# Patient Record
Sex: Female | Born: 1950
Health system: Southern US, Community
[De-identification: ages and names within clinical notes are randomized; demographics above are authoritative.]

## PROBLEM LIST (undated history)

## (undated) DIAGNOSIS — M1990 Unspecified osteoarthritis, unspecified site: Secondary | ICD-10-CM

## (undated) DIAGNOSIS — I10 Essential (primary) hypertension: Secondary | ICD-10-CM

## (undated) DIAGNOSIS — T7840XA Allergy, unspecified, initial encounter: Secondary | ICD-10-CM

## (undated) DIAGNOSIS — F419 Anxiety disorder, unspecified: Secondary | ICD-10-CM

## (undated) DIAGNOSIS — Z973 Presence of spectacles and contact lenses: Secondary | ICD-10-CM

## (undated) DIAGNOSIS — R5600 Simple febrile convulsions: Secondary | ICD-10-CM

## (undated) DIAGNOSIS — E278 Other specified disorders of adrenal gland: Secondary | ICD-10-CM

## (undated) DIAGNOSIS — R011 Cardiac murmur, unspecified: Secondary | ICD-10-CM

## (undated) HISTORY — DX: Essential (primary) hypertension: I10

## (undated) HISTORY — DX: Unspecified osteoarthritis, unspecified site: M19.90

## (undated) HISTORY — DX: Cardiac murmur, unspecified: R01.1

## (undated) HISTORY — DX: Simple febrile convulsions: R56.00

## (undated) HISTORY — DX: Allergy, unspecified, initial encounter: T78.40XA

## (undated) HISTORY — DX: Presence of spectacles and contact lenses: Z97.3

## (undated) HISTORY — DX: Other specified disorders of adrenal gland: E27.8

## (undated) HISTORY — PX: BREAST ENHANCEMENT SURGERY: SHX7

## (undated) HISTORY — DX: Anxiety disorder, unspecified: F41.9

---

## 1999-01-10 ENCOUNTER — Other Ambulatory Visit: Admission: RE | Admit: 1999-01-10 | Discharge: 1999-01-10 | Payer: Self-pay | Admitting: Obstetrics and Gynecology

## 2000-03-02 ENCOUNTER — Other Ambulatory Visit: Admission: RE | Admit: 2000-03-02 | Discharge: 2000-03-02 | Payer: Self-pay | Admitting: Obstetrics and Gynecology

## 2001-06-15 ENCOUNTER — Other Ambulatory Visit: Admission: RE | Admit: 2001-06-15 | Discharge: 2001-06-15 | Payer: Self-pay | Admitting: Gynecology

## 2003-05-22 ENCOUNTER — Other Ambulatory Visit: Admission: RE | Admit: 2003-05-22 | Discharge: 2003-05-22 | Payer: Self-pay | Admitting: Gynecology

## 2004-10-11 ENCOUNTER — Other Ambulatory Visit: Admission: RE | Admit: 2004-10-11 | Discharge: 2004-10-11 | Payer: Self-pay | Admitting: Gynecology

## 2006-01-05 ENCOUNTER — Other Ambulatory Visit: Admission: RE | Admit: 2006-01-05 | Discharge: 2006-01-05 | Payer: Self-pay | Admitting: Gynecology

## 2016-08-13 DIAGNOSIS — M5412 Radiculopathy, cervical region: Secondary | ICD-10-CM | POA: Diagnosis not present

## 2016-09-02 DIAGNOSIS — H5213 Myopia, bilateral: Secondary | ICD-10-CM | POA: Diagnosis not present

## 2016-09-02 DIAGNOSIS — H52221 Regular astigmatism, right eye: Secondary | ICD-10-CM | POA: Diagnosis not present

## 2016-09-02 DIAGNOSIS — H25813 Combined forms of age-related cataract, bilateral: Secondary | ICD-10-CM | POA: Diagnosis not present

## 2016-09-04 DIAGNOSIS — G8929 Other chronic pain: Secondary | ICD-10-CM | POA: Diagnosis not present

## 2016-09-04 DIAGNOSIS — M542 Cervicalgia: Secondary | ICD-10-CM | POA: Diagnosis not present

## 2016-09-04 DIAGNOSIS — I10 Essential (primary) hypertension: Secondary | ICD-10-CM | POA: Diagnosis not present

## 2016-09-19 DIAGNOSIS — G8929 Other chronic pain: Secondary | ICD-10-CM | POA: Diagnosis not present

## 2016-09-19 DIAGNOSIS — M542 Cervicalgia: Secondary | ICD-10-CM | POA: Diagnosis not present

## 2016-09-19 DIAGNOSIS — I10 Essential (primary) hypertension: Secondary | ICD-10-CM | POA: Diagnosis not present

## 2016-10-23 DIAGNOSIS — G8929 Other chronic pain: Secondary | ICD-10-CM | POA: Diagnosis not present

## 2016-10-23 DIAGNOSIS — I10 Essential (primary) hypertension: Secondary | ICD-10-CM | POA: Diagnosis not present

## 2016-10-23 DIAGNOSIS — Z79899 Other long term (current) drug therapy: Secondary | ICD-10-CM | POA: Diagnosis not present

## 2016-10-23 DIAGNOSIS — M542 Cervicalgia: Secondary | ICD-10-CM | POA: Diagnosis not present

## 2016-12-15 DIAGNOSIS — G8929 Other chronic pain: Secondary | ICD-10-CM | POA: Diagnosis not present

## 2016-12-15 DIAGNOSIS — M542 Cervicalgia: Secondary | ICD-10-CM | POA: Diagnosis not present

## 2016-12-15 DIAGNOSIS — I10 Essential (primary) hypertension: Secondary | ICD-10-CM | POA: Diagnosis not present

## 2017-10-05 ENCOUNTER — Telehealth: Payer: Self-pay | Admitting: Internal Medicine

## 2017-10-05 NOTE — Telephone Encounter (Signed)
Copied from CRM [phone]. Topic: Quick Communication - See Telephone Encounter >> Oct 05, 2017  3:47 PM Oneal GroutSebastian, Jennifer S wrote: CRM for notification. See Telephone encounter for: 10/05/17. Patient to establish care on 10/20/17, Requesting a RX of candesartan-HCTZ 32-12.5mg  TB, able to call in before appt? Patient wanted to check and see. Please advise [facility] [phone]

## 2017-10-05 NOTE — Telephone Encounter (Signed)
This patient has never been seen here so we cant refill this medication until she is seen  Please contact last prescriber  For this patient and all new patients we can never refill medications until seen in our clinic   TMS

## 2017-10-06 NOTE — Telephone Encounter (Signed)
Patient advised of result and verbalized an understanding.  

## 2017-10-20 ENCOUNTER — Ambulatory Visit (INDEPENDENT_AMBULATORY_CARE_PROVIDER_SITE_OTHER): Payer: Medicare Other | Admitting: Internal Medicine

## 2017-10-20 VITALS — BP 144/90 | HR 59 | Temp 98.6°F | Ht 62.5 in | Wt 218.2 lb

## 2017-10-20 DIAGNOSIS — E559 Vitamin D deficiency, unspecified: Secondary | ICD-10-CM | POA: Diagnosis not present

## 2017-10-20 DIAGNOSIS — Z1329 Encounter for screening for other suspected endocrine disorder: Secondary | ICD-10-CM | POA: Diagnosis not present

## 2017-10-20 DIAGNOSIS — F419 Anxiety disorder, unspecified: Secondary | ICD-10-CM

## 2017-10-20 DIAGNOSIS — M542 Cervicalgia: Secondary | ICD-10-CM | POA: Diagnosis not present

## 2017-10-20 DIAGNOSIS — I10 Essential (primary) hypertension: Secondary | ICD-10-CM

## 2017-10-20 DIAGNOSIS — Z1389 Encounter for screening for other disorder: Secondary | ICD-10-CM

## 2017-10-20 DIAGNOSIS — R011 Cardiac murmur, unspecified: Secondary | ICD-10-CM | POA: Insufficient documentation

## 2017-10-20 DIAGNOSIS — G8929 Other chronic pain: Secondary | ICD-10-CM | POA: Insufficient documentation

## 2017-10-20 DIAGNOSIS — Z13220 Encounter for screening for lipoid disorders: Secondary | ICD-10-CM

## 2017-10-20 MED ORDER — CANDESARTAN CILEXETIL-HCTZ 32-12.5 MG PO TABS: 1.0000 | ORAL_TABLET | Freq: Every day | ORAL | 1 refills | Status: DC

## 2017-10-20 MED ORDER — FLUOXETINE HCL 10 MG PO CAPS: 10.0000 mg | ORAL_CAPSULE | Freq: Every day | ORAL | 3 refills | Status: DC

## 2017-10-20 MED ORDER — NEBIVOLOL HCL 10 MG PO TABS: 10.0000 mg | ORAL_TABLET | Freq: Every day | ORAL | 1 refills | Status: DC

## 2017-10-20 NOTE — Progress Notes (Signed)
Pre visit review using our clinic review tool, if applicable. No additional management support is needed unless otherwise documented below in the visit note. 

## 2017-10-20 NOTE — Patient Instructions (Addendum)
Try Meditation Insight Timer free app on phone  Try exercise  Try prozac 10 mg     Tdap/DTaP Vaccine (Diphtheria, Tetanus, and Pertussis): What You Need to Know 1. Why get vaccinated? Diphtheria, tetanus, and pertussis are serious diseases caused by bacteria. Diphtheria and pertussis are spread from person to person. Tetanus enters the body through cuts or wounds. DIPHTHERIA causes a thick covering in the back of the throat.  It can lead to breathing problems, paralysis, heart failure, and even death.  TETANUS (Lockjaw) causes painful tightening of the muscles, usually all over the body.  It can lead to "locking" of the jaw so the victim cannot open his mouth or swallow. Tetanus leads to death in up to 2 out of 10 cases.  PERTUSSIS (Whooping Cough) causes coughing spells so bad that it is hard for infants to eat, drink, or breathe. These spells can last for weeks.  It can lead to pneumonia, seizures (jerking and staring spells), brain damage, and death.  Diphtheria, tetanus, and pertussis vaccine (DTaP) can help prevent these diseases. Most children who are vaccinated with DTaP will be protected throughout childhood. Many more children would get these diseases if we stopped vaccinating. DTaP is a safer version of an older vaccine called DTP. DTP is no longer used in the [place]. 2. Who should get DTaP vaccine and when? Children should get 5 doses of DTaP vaccine, one dose at each of the following ages:  2 months  4 months  6 months  15-18 months  4-6 years  DTaP may be given at the same time as other vaccines. 3. Some children should not get DTaP vaccine or should wait  Children with minor illnesses, such as a cold, may be vaccinated. But children who are moderately or severely ill should usually wait until they recover before getting DTaP vaccine.  Any child who had a life-threatening allergic reaction after a dose of DTaP should not get another dose.  Any child who  suffered a brain or nervous system disease within 7 days after a dose of DTaP should not get another dose.  Talk with your doctor if your child: ? had a seizure or collapsed after a dose of DTaP, ? cried non-stop for 3 hours or more after a dose of DTaP, ? had a fever over 105F after a dose of DTaP. Ask your doctor for more information. Some of these children should not get another dose of pertussis vaccine, but may get a vaccine without pertussis, called DT. 4. Older children and adults DTaP is not licensed for adolescents, adults, or children 817 years of age and older. But older people still need protection. A vaccine called Tdap is similar to DTaP. A single dose of Tdap is recommended for people 11 through 69 years of age. Another vaccine, called Td, protects against tetanus and diphtheria, but not pertussis. It is recommended every 10 years. There are separate Vaccine Information Statements for these vaccines. 5. What are the risks from DTaP vaccine? Getting diphtheria, tetanus, or pertussis disease is much riskier than getting DTaP vaccine. However, a vaccine, like any medicine, is capable of causing serious problems, such as severe allergic reactions. The risk of DTaP vaccine causing serious harm, or death, is extremely small. Mild problems (common)  Fever (up to about 1 child in 4)  Redness or swelling where the shot was given (up to about 1 child in 4)  Soreness or tenderness where the shot was given (up to about 1  child in 4) These problems occur more often after the 4th and 5th doses of the DTaP series than after earlier doses. Sometimes the 4th or 5th dose of DTaP vaccine is followed by swelling of the entire arm or leg in which the shot was given, lasting 1-7 days (up to about 1 child in 30). Other mild problems include:  Fussiness (up to about 1 child in 3)  Tiredness or poor appetite (up to about 1 child in 10)  Vomiting (up to about 1 child in 50) These problems generally  occur 1-3 days after the shot. Moderate problems (uncommon)  Seizure (jerking or staring) (about 1 child out of 14,000)  Non-stop crying, for 3 hours or more (up to about 1 child out of 1,000)  High fever, over 105F (about 1 child out of 16,000) Severe problems (very rare)  Serious allergic reaction (less than 1 out of a million doses)  Several other severe problems have been reported after DTaP vaccine. These include: ? Long-term seizures, coma, or lowered consciousness ? Permanent brain damage. These are so rare it is hard to tell if they are caused by the vaccine. Controlling fever is especially important for children who have had seizures, for any reason. It is also important if another family member has had seizures. You can reduce fever and pain by giving your child an aspirin-free pain reliever when the shot is given, and for the next 24 hours, following the package instructions. 6. What if there is a serious reaction? What should I look for? Look for anything that concerns you, such as signs of a severe allergic reaction, very high fever, or behavior changes. Signs of a severe allergic reaction can include hives, swelling of the face and throat, difficulty breathing, a fast heartbeat, dizziness, and weakness. These would start a few minutes to a few hours after the vaccination. What should I do?  If you think it is a severe allergic reaction or other emergency that can't wait, call 9-1-1 or get the person to the nearest hospital. Otherwise, call your doctor.  Afterward, the reaction should be reported to the Vaccine Adverse Event Reporting System (VAERS). Your doctor might file this report, or you can do it yourself through the VAERS web site at www.vaers.LAgents.no, or by calling 1-614-488-9439. ? VAERS is only for reporting reactions. They do not give medical advice. 7. The National Vaccine Injury Compensation Program The [employer] Vaccine Injury Compensation Program (VICP) is a  federal program that was created to compensate people who may have been injured by certain vaccines. Persons who believe they may have been injured by a vaccine can learn about the program and about filing a claim by calling 1-854-512-4405 or visiting the VICP website at SpiritualWord.at. 8. How can I learn more?  Ask your doctor.  Call your local or state health department.  Contact the Centers for Disease Control and Prevention (CDC): ? Call 7823104983 (1-800-CDC-INFO) or ? Visit CDC's website at PicCapture.uy CDC DTaP Vaccine (Diphtheria, Tetanus, and Pertussis) VIS (07/31/05) This information is not intended to replace advice given to you by your health care provider. Make sure you discuss any questions you have with your health care provider. Document Released: 12/29/2005 Document Revised: 11/22/2015 Document Reviewed: 11/22/2015 Elsevier Interactive Patient Education  2017 [employer].

## 2017-10-21 ENCOUNTER — Encounter: Payer: Self-pay | Admitting: Internal Medicine

## 2017-10-21 NOTE — Progress Notes (Signed)
Chief Complaint  Patient presents with  . Establish Care   New patient prior PCP [facility] Dr. Sheryn Bison retired prior to that [facility] but changing due to they lost her blood   1. HTN sl elevated today at home 150s sbp on wrists on candesartan-hctz 09.3 23.5 and bystolic 10 mg qd. She reports anxiety with life and work stressors with elevates BP prev on prozac low dose and helped  2. Anxiety wants to restart prozac. She does not want addictive meds and reports benzos possibly in the past made her hyper/excited.   3. H/o cardiac murmur had echo in the past with [facility] requested records today  4. Chronic neck pain 2/2 cervical disc degeneration x 2 disc saw Dr. Jacqulyn Bath and considering PT and steroid shot c/o radiating pain down right arm.    Review of Systems  Constitutional: Negative for weight loss.  HENT: Negative for hearing loss.   Eyes: Negative for blurred vision.  Respiratory: Negative for shortness of breath.   Cardiovascular: Negative for chest pain.  Gastrointestinal: Negative for abdominal pain.  Musculoskeletal: Positive for neck pain.  Skin: Negative for rash.  Neurological: Negative for headaches.  Psychiatric/Behavioral: Negative for memory loss. The patient is nervous/anxious.    Past Medical History:  Diagnosis Date  . Allergy    mold and mildew   . Anxiety   . Arthritis    cervical disc degeneration Dr. Lanelle Bal   . Febrile seizures ([place])    childhood x 2 stopped age 55 or 67 y.o   . Heart murmur   . Hypertension    Past Surgical History:  Procedure Laterality Date  . BREAST ENHANCEMENT SURGERY     History reviewed. No pertinent family history. Social History   Socioeconomic History  . Marital status: Unknown    Spouse name: Not on file  . Number of children: Not on file  . Years of education: Not on file  . Highest education level: Not on file  Occupational History  . Not on file  Social Needs  . Financial resource strain: Not on file   . Food insecurity:    Worry: Not on file    Inability: Not on file  . Transportation needs:    Medical: Not on file    Non-medical: Not on file  Tobacco Use  . Smoking status: Never Smoker  . Smokeless tobacco: Never Used  Substance and Sexual Activity  . Alcohol use: Yes    Comment: rare   . Drug use: Not Currently  . Sexual activity: Not Currently  Lifestyle  . Physical activity:    Days per week: Not on file    Minutes per session: Not on file  . Stress: Not on file  Relationships  . Social connections:    Talks on phone: Not on file    Gets together: Not on file    Attends religious service: Not on file    Active member of club or organization: Not on file    Attends meetings of clubs or organizations: Not on file    Relationship status: Not on file  . Intimate partner violence:    Fear of current or ex partner: Not on file    Emotionally abused: Not on file    Physically abused: Not on file    Forced sexual activity: Not on file  Other Topics Concern  . Not on file  Social History Narrative  . Not on file   Current Meds  Medication  Sig  . candesartan-hydrochlorothiazide (ATACAND HCT) 32-12.5 MG tablet Take 1 tablet by mouth daily.  . nebivolol (BYSTOLIC) 10 MG tablet Take 1 tablet (10 mg total) by mouth daily.  . [DISCONTINUED] BYSTOLIC 10 MG tablet Take 10 mg by mouth daily.  . [DISCONTINUED] candesartan-hydrochlorothiazide (ATACAND HCT) 32-12.5 MG tablet Take 1 tablet by mouth daily.   No Known Allergies No results found for this or any previous visit (from the past 2160 hour(s)). Objective  Body mass index is 39.27 kg/m. Wt Readings from Last 3 Encounters:  10/20/17 218 lb 3.2 oz (99 kg)   Temp Readings from Last 3 Encounters:  10/20/17 98.6 F (37 C) (Oral)   BP Readings from Last 3 Encounters:  10/20/17 (!) 144/90   Pulse Readings from Last 3 Encounters:  10/20/17 (!) 59    Physical Exam  Constitutional: She is oriented to person, place, and  time. She appears well-developed and well-nourished. She is cooperative.  HENT:  Head: Normocephalic and atraumatic.  Mouth/Throat: Oropharynx is clear and moist and mucous membranes are normal.  Eyes: Pupils are equal, round, and reactive to light. Conjunctivae are normal.  Cardiovascular: Normal rate and regular rhythm.  Murmur heard. Pulmonary/Chest: Effort normal and breath sounds normal.  Neurological: She is alert and oriented to person, place, and time. Gait normal.  Skin: Skin is warm, dry and intact.  Psychiatric: She has a normal mood and affect. Her speech is normal and behavior is normal. Judgment and thought content normal. Cognition and memory are normal.  Nursing note and vitals reviewed.   Assessment   1. HTN  2. Anxiety  3. Cardiac murmur subtle  4. Chronic neck pain 2/2 disc degeneration x 2 cervical  5. HM Plan  1. Cont meds refilled today  Needs to sch appt fasting labs  2. Start low dose prozac  Disc meditation for anxiety  3. Get echo [facility]  4. F/u with Raliegh Ip  5.  Declines flu shot  Tdap will think about discussed today  Declines pneumonia vaccines Declines hep B/C testing may have had hep b in past  No MMR testing   sch fasting labs pt to call back to sch  Declines mammo for now will need mammo for breast augmentation  Out of age window pap Dr. Tamala Julian years ago DEXA nl 5 years ago per pt consider repeating in future pt declines today  Colonoscopy declines but agreeable cologaurd will order today  Dermatology saw 2 years ago in [place] exam summer 2018 Marica Otter  Former PCP [facility] Dr. Sheryn Bison and [facility] medical requested records Dr. Sheryn Bison   Provider: Dr. Olivia Mackie McLean-Scocuzza-Internal Medicine

## 2018-02-18 ENCOUNTER — Ambulatory Visit: Payer: Medicare Other | Admitting: Internal Medicine

## 2018-04-14 ENCOUNTER — Other Ambulatory Visit: Payer: Self-pay | Admitting: Internal Medicine

## 2018-04-14 DIAGNOSIS — I10 Essential (primary) hypertension: Secondary | ICD-10-CM

## 2018-04-14 MED ORDER — CANDESARTAN CILEXETIL-HCTZ 32-12.5 MG PO TABS: 1.0000 | ORAL_TABLET | Freq: Every day | ORAL | 1 refills | Status: DC

## 2018-05-13 ENCOUNTER — Ambulatory Visit: Payer: Medicare Other | Admitting: Internal Medicine

## 2018-07-27 ENCOUNTER — Other Ambulatory Visit: Payer: Self-pay | Admitting: Internal Medicine

## 2018-07-27 ENCOUNTER — Telehealth: Payer: Self-pay | Admitting: Internal Medicine

## 2018-07-27 DIAGNOSIS — I10 Essential (primary) hypertension: Secondary | ICD-10-CM

## 2018-07-27 MED ORDER — CANDESARTAN CILEXETIL-HCTZ 32-12.5 MG PO TABS: 1.0000 | ORAL_TABLET | Freq: Every day | ORAL | 0 refills | Status: DC

## 2018-07-27 MED ORDER — NEBIVOLOL HCL 10 MG PO TABS: 10.0000 mg | ORAL_TABLET | Freq: Every day | ORAL | 0 refills | Status: DC

## 2018-07-27 NOTE — Telephone Encounter (Signed)
Pt needs to schedule telephone, virtual visit or after 09/2018 office visit for blood pressure f/u   Thanks TMS

## 2018-07-30 ENCOUNTER — Telehealth: Payer: Self-pay | Admitting: Internal Medicine

## 2018-07-30 ENCOUNTER — Other Ambulatory Visit: Payer: Self-pay | Admitting: Internal Medicine

## 2018-07-30 DIAGNOSIS — I10 Essential (primary) hypertension: Secondary | ICD-10-CM

## 2018-07-30 MED ORDER — OLMESARTAN MEDOXOMIL-HCTZ 40-12.5 MG PO TABS: 1.0000 | ORAL_TABLET | Freq: Every day | ORAL | 0 refills | Status: DC

## 2018-07-30 MED ORDER — NEBIVOLOL HCL 10 MG PO TABS: 10.0000 mg | ORAL_TABLET | Freq: Every day | ORAL | 0 refills | Status: DC

## 2018-07-30 MED ORDER — CANDESARTAN CILEXETIL-HCTZ 32-12.5 MG PO TABS: 1.0000 | ORAL_TABLET | Freq: Every day | ORAL | 0 refills | Status: DC

## 2018-07-30 NOTE — Telephone Encounter (Signed)
Unable to leave message for patient to return call back. PEC may give and obtain information.  

## 2018-07-30 NOTE — Telephone Encounter (Signed)
Advise pt   candasaratan hctz 32-12.5 is on back order  Sent olmesartan hctz 40-12.5 instead   TMS

## 2018-07-30 NOTE — Telephone Encounter (Signed)
Medication is on back order. The candesartan is not supposed to be in stock until JUL

## 2018-07-30 NOTE — Telephone Encounter (Signed)
Call pharmacy and see what is going on with Rx Candesartan 32 mg hctz 12.5 mg daily #90 no refills ?   [phone]

## 2018-09-16 ENCOUNTER — Ambulatory Visit: Payer: Medicare Other | Admitting: Internal Medicine

## 2019-05-07 ENCOUNTER — Ambulatory Visit: Payer: Medicare Other

## 2019-05-13 DIAGNOSIS — Z23 Encounter for immunization: Secondary | ICD-10-CM | POA: Diagnosis not present

## 2019-05-25 ENCOUNTER — Encounter: Payer: Self-pay | Admitting: Internal Medicine

## 2019-05-25 ENCOUNTER — Other Ambulatory Visit: Payer: Self-pay | Admitting: Internal Medicine

## 2019-05-25 ENCOUNTER — Telehealth (INDEPENDENT_AMBULATORY_CARE_PROVIDER_SITE_OTHER): Payer: Medicare Other | Admitting: Internal Medicine

## 2019-05-25 VITALS — BP 156/73 | HR 77 | Ht 62.5 in | Wt 220.0 lb

## 2019-05-25 DIAGNOSIS — F419 Anxiety disorder, unspecified: Secondary | ICD-10-CM

## 2019-05-25 DIAGNOSIS — Z1389 Encounter for screening for other disorder: Secondary | ICD-10-CM

## 2019-05-25 DIAGNOSIS — Z1329 Encounter for screening for other suspected endocrine disorder: Secondary | ICD-10-CM

## 2019-05-25 DIAGNOSIS — E559 Vitamin D deficiency, unspecified: Secondary | ICD-10-CM | POA: Diagnosis not present

## 2019-05-25 DIAGNOSIS — I10 Essential (primary) hypertension: Secondary | ICD-10-CM | POA: Diagnosis not present

## 2019-05-25 DIAGNOSIS — R739 Hyperglycemia, unspecified: Secondary | ICD-10-CM

## 2019-05-25 MED ORDER — CANDESARTAN CILEXETIL-HCTZ 32-12.5 MG PO TABS: 1.0000 | ORAL_TABLET | Freq: Every day | ORAL | 3 refills | Status: DC

## 2019-05-25 MED ORDER — FLUOXETINE HCL 10 MG PO TABS: 5.0000 mg | ORAL_TABLET | Freq: Every day | ORAL | 3 refills | Status: DC

## 2019-05-25 MED ORDER — NEBIVOLOL HCL 10 MG PO TABS: 10.0000 mg | ORAL_TABLET | Freq: Every day | ORAL | 3 refills | Status: DC

## 2019-05-25 NOTE — Progress Notes (Signed)
Virtual Visit via Video Note  I connected with Patricia [name]  on 05/25/19 at 11:45 AM EST by a video enabled telemedicine application and verified that I am speaking with the correct person using two identifiers.  Location patient: home Location provider:work or home office Persons participating in the virtual visit: patient, provider  I discussed the limitations of evaluation and management by telemedicine and the availability of in person appointments. The patient expressed understanding and agreed to proceed.   HPI: 1. HTN needs refills of candasartan 32-hctz 24.4 mg qd and bystolic 10 mg qd BP elevated today due to being out of meds no f/u since 10/2017  when she had wt loss BP was running <120s top  She declines labs for now due to covid  2. Anxiety was improved on prozac was taking 10 mg qod b/c daily was making her not feel emotions  3. Stomach virus 2 months ago x 2-4 days after eating a sandwich she ordered out had n/v/d no fever but resolved with brat diet and peptobismol    ROS: See pertinent positives and negatives per HPI.  Past Medical History:  Diagnosis Date  . Allergy    mold and mildew   . Anxiety   . Arthritis    cervical disc degeneration Dr. Lanelle Bal   . Febrile seizures ([place])    childhood x 2 stopped age 64 or 69 y.o   . Heart murmur   . Hypertension     Past Surgical History:  Procedure Laterality Date  . BREAST ENHANCEMENT SURGERY      Family History  Problem Relation Age of Onset  . Arthritis Mother   . COPD Mother        smoker   . Hyperlipidemia Mother   . Hypertension Mother   . Cancer Father        prostate died age 17   . Early death Father   . COPD Brother        smoker     SOCIAL HX:  Divorced  2 years college  Works Biochemist, clinical  No guns, wears seat belt, safe in relationship  No kids  Has cats     Current Outpatient Medications:  .  candesartan-hydrochlorothiazide (ATACAND HCT) 32-12.5 MG tablet, Take 1  tablet by mouth daily. Take 1/2 pill x 3 days then 1 pill daily in the am, Disp: 90 tablet, Rfl: 3 .  FLUoxetine (PROZAC) 10 MG tablet, Take 0.5 tablets (5 mg total) by mouth daily., Disp: 45 tablet, Rfl: 3 .  nebivolol (BYSTOLIC) 10 MG tablet, Take 1 tablet (10 mg total) by mouth daily., Disp: 90 tablet, Rfl: 3  EXAM:  VITALS per patient if applicable:  GENERAL: alert, oriented, appears well and in no acute distress  HEENT: atraumatic, conjunttiva clear, no obvious abnormalities on inspection of external nose and ears  NECK: normal movements of the head and neck  LUNGS: on inspection no signs of respiratory distress, breathing rate appears normal, no obvious gross SOB, gasping or wheezing  CV: no obvious cyanosis  MS: moves all visible extremities without noticeable abnormality  PSYCH/NEURO: pleasant and cooperative, no obvious depression or anxiety, speech and thought processing grossly intact  ASSESSMENT AND PLAN:  Discussed the following assessment and plan: Goal BP <130/<80  Essential hypertension - Plan: nebivolol (BYSTOLIC) 10 MG tablet, candesartan-hydrochlorothiazide (ATACAND HCT) 32-12.5 MG tablet  Anxiety - Plan: FLUoxetine (PROZAC) 10 MG tablet  Take 5 mg qd   HM Declines fasting labs for  now ordered for future  Declines flu shot  Tdap will think about discussed today  1/2 covid vaccines 2nd due 05/2019  Declines pneumonia vaccines Declines hep B/C testing may have had hep b in past  No MMR testing   sch fasting labs pt to call back to sch  Declines mammo for now will need mammo for breast augmentation  Out of age window pap Dr. Tamala Julian years ago DEXA nl 5 years ago per pt consider repeating in future pt declined previously  Colonoscopy declined but agreeable cologaurd  Will order in future  Dermatology saw 2 years ago in [place] exam summer 2018 Marica Otter  Former PCP [facility] Dr. Sheryn Bison and [facility] medical requested records Dr. Sheryn Bison   -we  discussed possible serious and likely etiologies, options for evaluation and workup, limitations of telemedicine visit vs in person visit, treatment, treatment risks and precautions. Pt prefers to treat via telemedicine empirically rather then risking or undertaking an in person visit at this moment. Patient agrees to seek prompt in person care if worsening, new symptoms arise, or if is not improving with treatment.   I discussed the assessment and treatment plan with the patient. The patient was provided an opportunity to ask questions and all were answered. The patient agreed with the plan and demonstrated an understanding of the instructions.   The patient was advised to call back or seek an in-person evaluation if the symptoms worsen or if the condition fails to improve as anticipated.  Time spent 30 minutes Delorise Jackson, MD

## 2019-05-27 ENCOUNTER — Other Ambulatory Visit: Payer: Self-pay | Admitting: Internal Medicine

## 2019-05-27 DIAGNOSIS — F419 Anxiety disorder, unspecified: Secondary | ICD-10-CM

## 2019-05-27 MED ORDER — ESCITALOPRAM OXALATE 5 MG PO TABS: 5.0000 mg | ORAL_TABLET | Freq: Every day | ORAL | 3 refills | Status: DC

## 2019-06-10 DIAGNOSIS — Z23 Encounter for immunization: Secondary | ICD-10-CM | POA: Diagnosis not present

## 2019-12-12 ENCOUNTER — Telehealth: Payer: Self-pay | Admitting: Internal Medicine

## 2019-12-12 NOTE — Telephone Encounter (Signed)
Left message for patient to call back and schedule Medicare Annual Wellness Visit (AWV)   This should be a telephone visit only=30 minutes.  No hx of AWV; please schedule at anytime with Denisa O'Brien-Blaney at [place]  AWV-I PER [place] AS OF 02/15/15 

## 2020-04-24 ENCOUNTER — Telehealth: Payer: Self-pay | Admitting: Internal Medicine

## 2020-04-24 NOTE — Telephone Encounter (Signed)
Left message for patient to call back and schedule Medicare Annual Wellness Visit (AWV)   This should be a telephone visit only=30 minutes.  No hx of AWV; please schedule at anytime with Denisa O'Brien-Blaney at [facility]  AWV-I PER [place] AS OF 02/15/15

## 2020-05-15 ENCOUNTER — Other Ambulatory Visit: Payer: Self-pay | Admitting: Internal Medicine

## 2020-05-15 DIAGNOSIS — I10 Essential (primary) hypertension: Secondary | ICD-10-CM

## 2020-05-17 ENCOUNTER — Telehealth: Payer: Self-pay | Admitting: Internal Medicine

## 2020-05-17 ENCOUNTER — Other Ambulatory Visit: Payer: Self-pay | Admitting: Internal Medicine

## 2020-05-17 DIAGNOSIS — F419 Anxiety disorder, unspecified: Secondary | ICD-10-CM

## 2020-05-17 NOTE — Telephone Encounter (Signed)
Cal pt needs appt further refills of meds >1 year

## 2020-05-18 NOTE — Telephone Encounter (Signed)
Lvm for pt to return call. Appt needed for more refills

## 2020-05-30 ENCOUNTER — Telehealth: Payer: Self-pay | Admitting: Internal Medicine

## 2020-05-30 ENCOUNTER — Other Ambulatory Visit: Payer: Self-pay | Admitting: Internal Medicine

## 2020-05-30 DIAGNOSIS — I10 Essential (primary) hypertension: Secondary | ICD-10-CM

## 2020-05-30 NOTE — Telephone Encounter (Signed)
Pt needs appt further refills meds call to sch please

## 2020-05-31 NOTE — Telephone Encounter (Signed)
LVM on both numbers to schedule appt for med management-due for yearly

## 2020-06-07 DIAGNOSIS — H524 Presbyopia: Secondary | ICD-10-CM | POA: Diagnosis not present

## 2020-06-07 DIAGNOSIS — H52221 Regular astigmatism, right eye: Secondary | ICD-10-CM | POA: Diagnosis not present

## 2020-06-07 DIAGNOSIS — H2513 Age-related nuclear cataract, bilateral: Secondary | ICD-10-CM | POA: Diagnosis not present

## 2020-06-07 DIAGNOSIS — H5213 Myopia, bilateral: Secondary | ICD-10-CM | POA: Diagnosis not present

## 2020-06-25 ENCOUNTER — Other Ambulatory Visit: Payer: Self-pay | Admitting: Internal Medicine

## 2020-06-25 DIAGNOSIS — I10 Essential (primary) hypertension: Secondary | ICD-10-CM

## 2020-06-25 MED ORDER — ATENOLOL 100 MG PO TABS: 100.0000 mg | ORAL_TABLET | Freq: Every day | ORAL | 0 refills | Status: DC

## 2020-07-09 ENCOUNTER — Other Ambulatory Visit: Payer: Self-pay | Admitting: Internal Medicine

## 2020-07-09 DIAGNOSIS — F419 Anxiety disorder, unspecified: Secondary | ICD-10-CM

## 2020-12-21 ENCOUNTER — Telehealth: Payer: Self-pay | Admitting: Internal Medicine

## 2020-12-21 NOTE — Telephone Encounter (Signed)
Patient has a virtual at the end of October. She would like to do labs before appointment. No orders in Chart.

## 2020-12-21 NOTE — Telephone Encounter (Signed)
I am going to defer the ordering of these labs to Dr McLean-Scocuzza.

## 2020-12-30 ENCOUNTER — Other Ambulatory Visit: Payer: Self-pay | Admitting: Internal Medicine

## 2020-12-30 DIAGNOSIS — F419 Anxiety disorder, unspecified: Secondary | ICD-10-CM

## 2020-12-31 NOTE — Telephone Encounter (Signed)
Last OV 2021 hs appoint scheduled for 01/11/21 okay to fill?

## 2021-01-11 ENCOUNTER — Telehealth (INDEPENDENT_AMBULATORY_CARE_PROVIDER_SITE_OTHER): Payer: Medicare Other | Admitting: Internal Medicine

## 2021-01-11 ENCOUNTER — Other Ambulatory Visit: Payer: Self-pay

## 2021-01-11 ENCOUNTER — Encounter: Payer: Self-pay | Admitting: Internal Medicine

## 2021-01-11 VITALS — BP 161/93 | HR 78 | Ht 62.5 in | Wt 220.0 lb

## 2021-01-11 DIAGNOSIS — R42 Dizziness and giddiness: Secondary | ICD-10-CM

## 2021-01-11 DIAGNOSIS — I10 Essential (primary) hypertension: Secondary | ICD-10-CM | POA: Diagnosis not present

## 2021-01-11 DIAGNOSIS — Z1231 Encounter for screening mammogram for malignant neoplasm of breast: Secondary | ICD-10-CM

## 2021-01-11 DIAGNOSIS — R739 Hyperglycemia, unspecified: Secondary | ICD-10-CM

## 2021-01-11 DIAGNOSIS — R232 Flushing: Secondary | ICD-10-CM

## 2021-01-11 DIAGNOSIS — E538 Deficiency of other specified B group vitamins: Secondary | ICD-10-CM | POA: Diagnosis not present

## 2021-01-11 DIAGNOSIS — R0982 Postnasal drip: Secondary | ICD-10-CM

## 2021-01-11 DIAGNOSIS — E2839 Other primary ovarian failure: Secondary | ICD-10-CM

## 2021-01-11 DIAGNOSIS — R5383 Other fatigue: Secondary | ICD-10-CM | POA: Diagnosis not present

## 2021-01-11 DIAGNOSIS — Z13818 Encounter for screening for other digestive system disorders: Secondary | ICD-10-CM

## 2021-01-11 DIAGNOSIS — Z1211 Encounter for screening for malignant neoplasm of colon: Secondary | ICD-10-CM

## 2021-01-11 DIAGNOSIS — F458 Other somatoform disorders: Secondary | ICD-10-CM | POA: Diagnosis not present

## 2021-01-11 DIAGNOSIS — Z1212 Encounter for screening for malignant neoplasm of rectum: Secondary | ICD-10-CM

## 2021-01-11 DIAGNOSIS — E559 Vitamin D deficiency, unspecified: Secondary | ICD-10-CM | POA: Diagnosis not present

## 2021-01-11 DIAGNOSIS — R519 Headache, unspecified: Secondary | ICD-10-CM | POA: Diagnosis not present

## 2021-01-11 DIAGNOSIS — R6889 Other general symptoms and signs: Secondary | ICD-10-CM

## 2021-01-11 DIAGNOSIS — M109 Gout, unspecified: Secondary | ICD-10-CM

## 2021-01-11 MED ORDER — COLCHICINE 0.6 MG PO TABS: ORAL_TABLET | ORAL | 0 refills | Status: DC

## 2021-01-11 MED ORDER — CARVEDILOL 12.5 MG PO TABS
ORAL_TABLET | ORAL | 2 refills | Status: DC
Start: 2021-01-11 — End: 2021-03-12

## 2021-01-11 NOTE — Progress Notes (Signed)
Onset of symptoms this past summer. Patient states she is having problems with dizziness and equilibrium while standing up and moving around. Mainly would start in the mornings and then linger throughout the day. Patient was getting extremely hot and sweaty without doing much during the summer months. Not so much now with the colder weather.   No falls, injuries, or loss of consciousness. Patient checks her blood pressure at home and states this has been up and down.   Patient states that for the past 8 weeks she has had foot pain in the right foot. Suspects possible gout. States the big toe on the right foot had gotten red and swollen. No pain today.

## 2021-01-11 NOTE — Patient Instructions (Addendum)
Gout diet  Low-Purine Eating Plan A low-purine eating plan involves making food choices to limit your intake of purine. Purine is a kind of uric acid. Too much uric acid in your blood can cause certain conditions, such as gout and kidney stones. Eating a low-purine diet can help control these conditions. What are tips for following this plan? Reading food labels Avoid foods with saturated or Trans fat. Check the ingredient list of grains-based foods, such as bread and cereal, to make sure that they contain whole grains. Check the ingredient list of sauces or soups to make sure they do not contain meat or fish. When choosing soft drinks, check the ingredient list to make sure they do not contain high-fructose corn syrup. Shopping  Buy plenty of fresh fruits and vegetables. Avoid buying canned or fresh fish. Buy dairy products labeled as low-fat or nonfat. Avoid buying premade or processed foods. These foods are often high in fat, salt (sodium), and added sugar. Cooking Use olive oil instead of butter when cooking. Oils like olive oil, canola oil, and sunflower oil contain healthy fats. Meal planning Learn which foods do or do not affect you. If you find out that a food tends to cause your gout symptoms to flare up, avoid eating that food. You can enjoy foods that do not cause problems. If you have any questions about a food item, talk with your dietitian or health care provider. Limit foods high in fat, especially saturated fat. Fat makes it harder for your body to get rid of uric acid. Choose foods that are lower in fat and are lean sources of protein. General guidelines Limit alcohol intake to no more than 1 drink a day for nonpregnant women and 2 drinks a day for men. One drink equals 12 oz of beer, 5 oz of wine, or 1 oz of hard liquor. Alcohol can affect the way your body gets rid of uric acid. Drink plenty of water to keep your urine clear or pale yellow. Fluids can help remove uric acid  from your body. If directed by your health care provider, take a vitamin C supplement. Work with your health care provider and dietitian to develop a plan to achieve or maintain a healthy weight. Losing weight can help reduce uric acid in your blood. What foods are recommended? The items listed may not be a complete list. Talk with your dietitian about what dietary choices are best for you. Foods low in purines Foods low in purines do not need to be limited. These include: All fruits. All low-purine vegetables, pickles, and olives. Breads, pasta, rice, cornbread, and popcorn. Cake and other baked goods. All dairy foods. Eggs, nuts, and nut butters. Spices and condiments, such as salt, herbs, and vinegar. Plant oils, butter, and margarine. Water, sugar-free soft drinks, tea, coffee, and cocoa. Vegetable-based soups, broths, sauces, and gravies. Foods moderate in purines Foods moderate in purines should be limited to the amounts listed.  cup of asparagus, cauliflower, spinach, mushrooms, or green peas, each day. 2/3 cup uncooked oatmeal, each day.  cup dry wheat bran or wheat germ, each day. 2-3 ounces of meat or poultry, each day. 4-6 ounces of shellfish, such as crab, lobster, oysters, or shrimp, each day. 1 cup cooked beans, peas, or lentils, each day. Soup, broths, or bouillon made from meat or fish. Limit these foods as much as possible. What foods are not recommended? The items listed may not be a complete list. Talk with your dietitian about what dietary  choices are best for you. Limit your intake of foods high in purines, including: Beer and other alcohol. Meat-based gravy or sauce. Canned or fresh fish, such as: Anchovies, sardines, herring, and tuna. Mussels and scallops. Codfish, trout, and haddock. Berniece Salines. Organ meats, such as: Liver or kidney. Tripe. Sweetbreads (thymus gland or pancreas). Wild Clinical biochemist. Yeast or yeast extract supplements. Drinks sweetened  with high-fructose corn syrup. Summary Eating a low-purine diet can help control conditions caused by too much uric acid in the body, such as gout or kidney stones. Choose low-purine foods, limit alcohol, and limit foods high in fat. You will learn over time which foods do or do not affect you. If you find out that a food tends to cause your gout symptoms to flare up, avoid eating that food. This information is not intended to replace advice given to you by your health care provider. Make sure you discuss any questions you have with your health care provider. Document Revised: 06/16/2019 Document Reviewed: 06/16/2019 Elsevier Patient Education  2022 [place].  Gout Gout is a condition that causes painful swelling of the joints. Gout is a type of inflammation of the joints (arthritis). This condition is caused by having too much uric acid in the body. Uric acid is a chemical that forms when the body breaks down substances called purines. Purines are important for building body proteins. When the body has too much uric acid, sharp crystals can form and build up inside the joints. This causes pain and swelling. Gout attacks can happen quickly and may be very painful (acute gout). Over time, the attacks can affect more joints and become more frequent (chronic gout). Gout can also cause uric acid to build up under the skin and inside the kidneys. What are the causes? This condition is caused by too much uric acid in your blood. This can happen because: Your kidneys do not remove enough uric acid from your blood. This is the most common cause. Your body makes too much uric acid. This can happen with some cancers and cancer treatments. It can also occur if your body is breaking down too many red blood cells (hemolytic anemia). You eat too many foods that are high in purines. These foods include organ meats and some seafood. Alcohol, especially beer, is also high in purines. A gout attack may be  triggered by trauma or stress. What increases the risk? You are more likely to develop this condition if you: Have a family history of gout. Are female and middle-aged. Are female and have gone through menopause. Are obese. Frequently drink alcohol, especially beer. Are dehydrated. Lose weight too quickly. Have an organ transplant. Have lead poisoning. Take certain medicines, including aspirin, cyclosporine, diuretics, levodopa, and niacin. Have kidney disease. Have a skin condition called psoriasis. What are the signs or symptoms? An attack of acute gout happens quickly. It usually occurs in just one joint. The most common place is the big toe. Attacks often start at night. Other joints that may be affected include joints of the feet, ankle, knee, fingers, wrist, or elbow. Symptoms of this condition may include: Severe pain. Warmth. Swelling. Stiffness. Tenderness. The affected joint may be very painful to touch. Shiny, red, or purple skin. Chills and fever. Chronic gout may cause symptoms more frequently. More joints may be involved. You may also have white or yellow lumps (tophi) on your hands or feet or in other areas near your joints. How is this diagnosed? This condition is diagnosed  based on your symptoms, medical history, and physical exam. You may have tests, such as: Blood tests to measure uric acid levels. Removal of joint fluid with a thin needle (aspiration) to look for uric acid crystals. X-rays to look for joint damage. How is this treated? Treatment for this condition has two phases: treating an acute attack and preventing future attacks. Acute gout treatment may include medicines to reduce pain and swelling, including: NSAIDs. Steroids. These are strong anti-inflammatory medicines that can be taken by mouth (orally) or injected into a joint. Colchicine. This medicine relieves pain and swelling when it is taken soon after an attack. It can be given by mouth or through  an IV. Preventive treatment may include: Daily use of smaller doses of NSAIDs or colchicine. Use of a medicine that reduces uric acid levels in your blood. Changes to your diet. You may need to see a dietitian about what to eat and drink to prevent gout. Follow these instructions at home: During a gout attack  If directed, put ice on the affected area: Put ice in a plastic bag. Place a towel between your skin and the bag. Leave the ice on for 20 minutes, 2-3 times a day. Raise (elevate) the affected joint above the level of your heart as often as possible. Rest the joint as much as possible. If the affected joint is in your leg, you may be given crutches to use. Follow instructions from your health care provider about eating or drinking restrictions. Avoiding future gout attacks Follow a low-purine diet as told by your dietitian or health care provider. Avoid foods and drinks that are high in purines, including liver, kidney, anchovies, asparagus, herring, mushrooms, mussels, and beer. Maintain a healthy weight or lose weight if you are overweight. If you want to lose weight, talk with your health care provider. It is important that you do not lose weight too quickly. Start or maintain an exercise program as told by your health care provider. Eating and drinking Drink enough fluids to keep your urine pale yellow. If you drink alcohol: Limit how much you use to: 0-1 drink a day for women. 0-2 drinks a day for men. Be aware of how much alcohol is in your drink. In the [place] one drink equals one 12 oz bottle of beer (355 mL) one 5 oz glass of wine (148 mL), or one 1 oz glass of hard liquor (44 mL). General instructions Take over-the-counter and prescription medicines only as told by your health care provider. Do not drive or use heavy machinery while taking prescription pain medicine. Return to your normal activities as told by your health care provider. Ask your health care provider what  activities are safe for you. Keep all follow-up visits as told by your health care provider. This is important. Contact a health care provider if you have: Another gout attack. Continuing symptoms of a gout attack after 10 days of treatment. Side effects from your medicines. Chills or a fever. Burning pain when you urinate. Pain in your lower back or belly. Get help right away if you: Have severe or uncontrolled pain. Cannot urinate. Summary Gout is painful swelling of the joints caused by inflammation. The most common site of pain is the big toe, but it can affect other joints in the body. Medicines and dietary changes can help to prevent and treat gout attacks. This information is not intended to replace advice given to you by your health care provider. Make sure you discuss any  questions you have with your health care provider. Document Revised: 09/23/2017 Document Reviewed: 09/23/2017 Elsevier Patient Education  [place].  Fatigue If you have fatigue, you feel tired all the time and have a lack of energy or a lack of motivation. Fatigue may make it difficult to start or complete tasks because of exhaustion. In general, occasional or mild fatigue is often a normal response to activity or life. However, long-lasting (chronic) or extreme fatigue may be a symptom of a medical condition. Follow these instructions at home: General instructions Watch your fatigue for any changes. Go to bed and get up at the same time every day. Avoid fatigue by pacing yourself during the day and getting enough sleep at night. Maintain a healthy weight. Medicines Take over-the-counter and prescription medicines only as told by your health care provider. Take a multivitamin, if told by your health care provider.  Do not use herbal or dietary supplements unless they are approved by your health care provider. Activity  Exercise regularly, as told by your health care provider. Use or practice  techniques to help you relax, such as yoga, tai chi, meditation, or massage therapy. Eating and drinking  Avoid heavy meals in the evening. Eat a well-balanced diet, which includes lean proteins, whole grains, plenty of fruits and vegetables, and low-fat dairy products. Avoid consuming too much caffeine. Avoid the use of alcohol. Drink enough fluid to keep your urine pale yellow. Lifestyle Change situations that cause you stress. Try to keep your work and personal schedule in balance. Do not use any products that contain nicotine or tobacco, such as cigarettes and e-cigarettes. If you need help quitting, ask your health care provider. Do not use drugs. Contact a health care provider if: Your fatigue does not get better. You have a fever. You suddenly lose or gain weight. You have headaches. You have trouble falling asleep or sleeping through the night. You feel angry, guilty, anxious, or sad. You are unable to have a bowel movement (constipation). Your skin is dry. You have swelling in your legs or another part of your body. Get help right away if: You feel confused. Your vision is blurry. You feel faint or you pass out. You have a severe headache. You have severe pain in your abdomen, your back, or the area between your waist and hips (pelvis). You have chest pain, shortness of breath, or an irregular or fast heartbeat. You are unable to urinate, or you urinate less than normal. You have abnormal bleeding, such as bleeding from the rectum, vagina, nose, lungs, or nipples. You vomit blood. You have thoughts about hurting yourself or others. If you ever feel like you may hurt yourself or others, or have thoughts about taking your own life, get help right away. You can go to your nearest emergency department or call: Your local emergency services (911 in the [place] A suicide crisis helpline, such as the [place] at (605) 498-5821. This is open 24 hours a  day. Summary If you have fatigue, you feel tired all the time and have a lack of energy or a lack of motivation. Fatigue may make it difficult to start or complete tasks because of exhaustion. Long-lasting (chronic) or extreme fatigue may be a symptom of a medical condition. Exercise regularly, as told by your health care provider. Change situations that cause you stress. Try to keep your work and personal schedule in balance. This information is not intended to replace advice given to you by your  health care provider. Make sure you discuss any questions you have with your health care provider. Document Revised: 01/12/2020 Document Reviewed: 01/12/2020 Elsevier Patient Education  2022 [employer].

## 2021-01-11 NOTE — Progress Notes (Signed)
Telephone Note  I connected with Patricia [name]  on 01/11/21 at  3:50 PM EDT by telephone and verified that I am speaking with the correct person using two identifiers.  Location patient: home, [place] Location provider:work or home office Persons participating in the virtual visit: patient, provider  I discussed the limitations of evaluation and management by telemedicine and the availability of in person appointments. The patient expressed understanding and agreed to proceed.   HPI:  Acute telemedicine visit for : Right foot gout thinks her brother had gout red, swollen 2-3 months ago bunion on both feet was pain below big toe lasted x 2 days came back and lasted 8 weeks takes prn advil dual strength. Aleve does not help now sore but if steps certain way kind of hurts,no injury  Dizziness x 2 weeks and dull h/a nasal congestion and forehead/eyes and ears tried claritin otc and helped worse in the am and at times all day long. She feels like has energy again and doing more, denies nasal congestion  Fatigue noted worse recently but had x 2 years  C/o overheating in the summers and sweating esp during summers  5.   Prev lesion right side of head above temple hurt at times years ago very painful prior PCP D.r Abigail Miyamoto noted it with pt she reports she does grind her teeth on the right side a lot note sure if related  6. Htn on candasartan hctz and atenolol 100 mg qd out of bb x 2-3 months and BP elevated   -COVID-19 vaccine status: 1/1  ROS: See pertinent positives and negatives per HPI.  Past Medical History:  Diagnosis Date   Allergy    mold and mildew    Anxiety    Arthritis    cervical disc degeneration Dr. Pablo Ledger    Febrile seizures [facility])    childhood x 2 stopped age 58 or 70 y.o    Heart murmur    Hypertension     Past Surgical History:  Procedure Laterality Date   BREAST ENHANCEMENT SURGERY       Current Outpatient Medications:    candesartan-hydrochlorothiazide  (ATACAND HCT) 32-12.5 MG tablet, TAKE 1/2 PILL X 3 DAYS THEN 1 PILL DAILY IN THE MOERNING, Disp: 90 tablet, Rfl: 3   carvedilol (COREG) 12.5 MG tablet, 1/2 pill bid x 3 days with food then increase to 1 pill bid day 4 and beyond stop atenolol, Disp: 60 tablet, Rfl: 2   colchicine 0.6 MG tablet, 2 pills x 1 then 0.6 mg 1 hour later max dose 1.8/24 hours do not repeat x 3 days, Disp: 30 tablet, Rfl: 0   escitalopram (LEXAPRO) 5 MG tablet, TAKE 1 TABLET BY MOUTH EVERY DAY, Disp: 90 tablet, Rfl: 1   Loratadine (CLARITIN PO), Take by mouth., Disp: , Rfl:    Omeprazole Magnesium (PRILOSEC PO), Take by mouth., Disp: , Rfl:   EXAM:  VITALS per patient if applicable:  GENERAL: alert, oriented, appears well and in no acute distress  PSYCH/NEURO: pleasant and cooperative, no obvious depression or anxiety, speech and thought processing grossly intact  ASSESSMENT AND PLAN:  Discussed the following assessment and plan:  Hypertension, unspecified type - Plan: Comprehensive metabolic panel, Lipid panel, CBC with Differential/Platelet, carvedilol (COREG) 12.5 MG tablet bid 1/2 bid x 3 days and day 4 take 1 pill bid stop atenolol 100 mg qd  Cont candasartan hctz 32-12.5 mg qd BP check 01/24/21  Monitor BP at home   Fatigue  Plan: Comprehensive metabolic panel, CBC with Differential/Platelet, TSH, Urinalysis, Routine w reflex microscopic, Cortisol-am, blood,b12, vitamin D  Gout, unspecified cause, unspecified chronicity, unspecified site - Plan: Uric acid, colchicine 0.6 MG tablet (1.2 repeat 0.6 in 1 hour) right great toe  Gout diet  Hyperglycemia - Plan: Hemoglobin A1c  Dizziness with uncontrolled htn and h/o h/a sinus pressure r/o sinusitis sxs of htn vs r/o stroke- Plan: CT HEAD WO CONTRAST ( )  Hot flashes/heat intolerance  Teeth grinding f/u dental  PND (post-nasal drip) claritin 10 mg prn otc is helping  HM Flu due Tdap vaccine due Pfizer x 1  Consider prevar due Consider pna 23  due Consider shingrix due  Mammogram ordered GIB Cologuard sent order  Out of age window for pap  Dexa consider  Rec healthy diet and exercise   -we discussed possible serious and likely etiologies, options for evaluation and workup, limitations of telemedicine visit vs in person visit, treatment, treatment risks and precautions. Pt is agreeable to treatment via telemedicine at this moment.    I discussed the assessment and treatment plan with the patient. The patient was provided an opportunity to ask questions and all were answered. The patient agreed with the plan and demonstrated an understanding of the instructions.    Time spent 30 minutes Bevelyn Buckles, MD

## 2021-01-14 ENCOUNTER — Other Ambulatory Visit: Payer: Self-pay | Admitting: Internal Medicine

## 2021-01-14 DIAGNOSIS — R519 Headache, unspecified: Secondary | ICD-10-CM

## 2021-01-14 DIAGNOSIS — R42 Dizziness and giddiness: Secondary | ICD-10-CM

## 2021-01-15 NOTE — Addendum Note (Signed)
Addended by: Tilford Pillar on: 01/15/2021 09:25 AM   Modules accepted: Orders

## 2021-01-24 ENCOUNTER — Ambulatory Visit: Payer: Medicare Other

## 2021-01-24 ENCOUNTER — Other Ambulatory Visit: Payer: Medicare Other

## 2021-01-31 ENCOUNTER — Other Ambulatory Visit: Payer: Medicare Other

## 2021-01-31 ENCOUNTER — Ambulatory Visit: Payer: Medicare Other

## 2021-02-06 ENCOUNTER — Ambulatory Visit: Payer: Medicare Other

## 2021-02-14 ENCOUNTER — Ambulatory Visit: Payer: Medicare Other

## 2021-02-22 ENCOUNTER — Other Ambulatory Visit: Payer: Self-pay

## 2021-02-22 ENCOUNTER — Ambulatory Visit: Payer: Medicare Other

## 2021-02-22 ENCOUNTER — Ambulatory Visit
Admission: RE | Admit: 2021-02-22 | Discharge: 2021-02-22 | Disposition: A | Discharge status: Home / Self Care | Payer: Medicare Other | Source: Ambulatory Visit | Attending: Internal Medicine | Admitting: Internal Medicine

## 2021-02-22 DIAGNOSIS — R519 Headache, unspecified: Secondary | ICD-10-CM | POA: Diagnosis not present

## 2021-02-22 DIAGNOSIS — R42 Dizziness and giddiness: Secondary | ICD-10-CM | POA: Insufficient documentation

## 2021-02-26 ENCOUNTER — Telehealth: Payer: Self-pay

## 2021-02-26 NOTE — Telephone Encounter (Signed)
-----   Message from Bevelyn Buckles, MD sent at 02/25/2021  9:18 AM EST ----- CT head normal  No cause for dizziness Monitor BP goal <130/<80  Also if has h/o vertigo or the room spinning could be vertigo consider ENT referral let me know?

## 2021-03-08 ENCOUNTER — Other Ambulatory Visit: Payer: Self-pay | Admitting: Internal Medicine

## 2021-03-08 DIAGNOSIS — I10 Essential (primary) hypertension: Secondary | ICD-10-CM

## 2021-03-14 ENCOUNTER — Ambulatory Visit: Payer: Medicare Other

## 2021-03-22 ENCOUNTER — Ambulatory Visit: Payer: Medicare Other

## 2021-04-11 ENCOUNTER — Ambulatory Visit: Payer: Medicare Other

## 2021-04-17 ENCOUNTER — Ambulatory Visit: Payer: Medicare Other

## 2021-05-05 ENCOUNTER — Other Ambulatory Visit: Payer: Self-pay | Admitting: Internal Medicine

## 2021-05-05 DIAGNOSIS — I10 Essential (primary) hypertension: Secondary | ICD-10-CM

## 2021-05-09 ENCOUNTER — Ambulatory Visit: Payer: Medicare Other

## 2021-05-10 ENCOUNTER — Ambulatory Visit: Payer: Medicare Other

## 2021-05-23 ENCOUNTER — Ambulatory Visit: Payer: Medicare Other

## 2021-06-06 ENCOUNTER — Other Ambulatory Visit: Payer: Self-pay | Admitting: Internal Medicine

## 2021-06-06 DIAGNOSIS — I10 Essential (primary) hypertension: Secondary | ICD-10-CM

## 2021-06-07 ENCOUNTER — Ambulatory Visit: Payer: Medicare Other | Admitting: Internal Medicine

## 2021-06-07 ENCOUNTER — Ambulatory Visit: Payer: Medicare Other

## 2021-06-14 ENCOUNTER — Ambulatory Visit: Payer: Medicare Other | Admitting: Internal Medicine

## 2021-06-27 ENCOUNTER — Other Ambulatory Visit: Payer: Self-pay | Admitting: Family

## 2021-06-27 DIAGNOSIS — F419 Anxiety disorder, unspecified: Secondary | ICD-10-CM

## 2021-07-01 ENCOUNTER — Other Ambulatory Visit: Payer: Self-pay | Admitting: Internal Medicine

## 2021-07-01 ENCOUNTER — Telehealth: Payer: Self-pay | Admitting: Internal Medicine

## 2021-07-01 ENCOUNTER — Telehealth: Payer: Self-pay

## 2021-07-01 DIAGNOSIS — F419 Anxiety disorder, unspecified: Secondary | ICD-10-CM

## 2021-07-01 MED ORDER — ESCITALOPRAM OXALATE 5 MG PO TABS: 5.0000 mg | ORAL_TABLET | Freq: Every day | ORAL | 0 refills | Status: DC

## 2021-07-01 NOTE — Telephone Encounter (Signed)
Pt overdue for labs ordered 12/2020  ?Sch lab visit and then f/u in person after labs  ?Advise no further refills on meds until both items complete please  ? ?

## 2021-07-01 NOTE — Telephone Encounter (Signed)
Lvm for pt to return call to inform her that per [id number]: ? ?Pt overdue for labs ordered 12/2020  ?Sch lab visit and then f/u in person after labs  ?Advise no further refills on meds until both items complete please  ?

## 2021-07-12 ENCOUNTER — Ambulatory Visit: Payer: Medicare Other | Admitting: Internal Medicine

## 2021-07-12 ENCOUNTER — Ambulatory Visit: Payer: Medicare Other

## 2021-07-26 ENCOUNTER — Ambulatory Visit: Payer: Medicare Other | Admitting: Internal Medicine

## 2021-07-26 ENCOUNTER — Ambulatory Visit: Payer: Medicare Other

## 2021-08-09 ENCOUNTER — Ambulatory Visit (INDEPENDENT_AMBULATORY_CARE_PROVIDER_SITE_OTHER): Payer: Medicare Other | Admitting: *Deleted

## 2021-08-09 ENCOUNTER — Ambulatory Visit: Payer: Medicare Other | Admitting: Internal Medicine

## 2021-08-09 ENCOUNTER — Telehealth: Payer: Self-pay | Admitting: Internal Medicine

## 2021-08-09 DIAGNOSIS — M109 Gout, unspecified: Secondary | ICD-10-CM | POA: Diagnosis not present

## 2021-08-09 DIAGNOSIS — R6889 Other general symptoms and signs: Secondary | ICD-10-CM | POA: Diagnosis not present

## 2021-08-09 DIAGNOSIS — E559 Vitamin D deficiency, unspecified: Secondary | ICD-10-CM

## 2021-08-09 DIAGNOSIS — I10 Essential (primary) hypertension: Secondary | ICD-10-CM

## 2021-08-09 DIAGNOSIS — R5383 Other fatigue: Secondary | ICD-10-CM

## 2021-08-09 DIAGNOSIS — Z13818 Encounter for screening for other digestive system disorders: Secondary | ICD-10-CM | POA: Diagnosis not present

## 2021-08-09 DIAGNOSIS — E538 Deficiency of other specified B group vitamins: Secondary | ICD-10-CM | POA: Diagnosis not present

## 2021-08-09 DIAGNOSIS — R739 Hyperglycemia, unspecified: Secondary | ICD-10-CM

## 2021-08-09 LAB — CBC WITH DIFFERENTIAL/PLATELET
Basophils Absolute: 0.1 10*3/uL (ref 0.0–0.1)
Basophils Relative: 1.4 % (ref 0.0–3.0)
Eosinophils Absolute: 0.2 10*3/uL (ref 0.0–0.7)
Eosinophils Relative: 3.4 % (ref 0.0–5.0)
HCT: 39.3 % (ref 36.0–46.0)
Hemoglobin: 12.9 g/dL (ref 12.0–15.0)
Lymphocytes Relative: 22.2 % (ref 12.0–46.0)
Lymphs Abs: 1.5 10*3/uL (ref 0.7–4.0)
MCHC: 32.9 g/dL (ref 30.0–36.0)
MCV: 88.6 fl (ref 78.0–100.0)
Monocytes Absolute: 0.6 10*3/uL (ref 0.1–1.0)
Monocytes Relative: 8.1 % (ref 3.0–12.0)
Neutro Abs: 4.5 10*3/uL (ref 1.4–7.7)
Neutrophils Relative %: 64.9 % (ref 43.0–77.0)
Platelets: 223 10*3/uL (ref 150.0–400.0)
RBC: 4.43 Mil/uL (ref 3.87–5.11)
RDW: 14.6 % (ref 11.5–15.5)
WBC: 6.9 10*3/uL (ref 4.0–10.5)

## 2021-08-09 LAB — COMPREHENSIVE METABOLIC PANEL
ALT: 10 U/L (ref 0–35)
AST: 14 U/L (ref 0–37)
Albumin: 4.4 g/dL (ref 3.5–5.2)
Alkaline Phosphatase: 52 U/L (ref 39–117)
BUN: 26 mg/dL — ABNORMAL HIGH (ref 6–23)
CO2: 24 mEq/L (ref 19–32)
Calcium: 9.9 mg/dL (ref 8.4–10.5)
Chloride: 102 mEq/L (ref 96–112)
Creatinine, Ser: 1.41 mg/dL — ABNORMAL HIGH (ref 0.40–1.20)
GFR: 37.29 mL/min — ABNORMAL LOW (ref >60.00)
Glucose, Bld: 89 mg/dL (ref 70–99)
Potassium: 4.3 mEq/L (ref 3.5–5.1)
Sodium: 140 mEq/L (ref 135–145)
Total Bilirubin: 0.7 mg/dL (ref 0.2–1.2)
Total Protein: 6.8 g/dL (ref 6.0–8.3)

## 2021-08-09 LAB — HEMOGLOBIN A1C: Hgb A1c MFr Bld: 5.2 % (ref 4.6–6.5)

## 2021-08-09 LAB — VITAMIN B12: Vitamin B-12: 245 pg/mL (ref 211–911)

## 2021-08-09 LAB — LIPID PANEL
Cholesterol: 215 mg/dL — ABNORMAL HIGH (ref 0–200)
HDL: 61.1 mg/dL (ref >39.00)
LDL Cholesterol: 136 mg/dL — ABNORMAL HIGH (ref 0–99)
NonHDL: 153.58
Total CHOL/HDL Ratio: 4
Triglycerides: 88 mg/dL (ref 0.0–149.0)
VLDL: 17.6 mg/dL (ref 0.0–40.0)

## 2021-08-09 LAB — TSH: TSH: 4.38 u[IU]/mL (ref 0.35–5.50)

## 2021-08-09 LAB — URIC ACID: Uric Acid, Serum: 9.2 mg/dL — ABNORMAL HIGH (ref 2.4–7.0)

## 2021-08-09 LAB — VITAMIN D 25 HYDROXY (VIT D DEFICIENCY, FRACTURES): VITD: 43.84 ng/mL (ref 30.00–100.00)

## 2021-08-09 NOTE — Progress Notes (Signed)
Patient here for nurse visit BP check per order from 07/01/2021.   Patient reports compliance with prescribed BP medications: yes  Patient taking :  ATACAND HCT 32-12.5 MG tablet and carvedilol (COREG) 12.5 MG tablet  BP Readings from Last 3 Encounters:  08/09/21 134/82  01/11/21 (!) 161/93  05/25/19 (!) 156/73   Pulse Readings from Last 3 Encounters:  08/09/21 74  01/11/21 78  05/25/19 77   Laying down for 5 minutes : B/P 138/84                                               HR : 85 Standing for 1 minute : BP 120/76                                      HR : 64 Standing for 3 minutes : BP 122/78                                        HR : 74  Patient advised to keep follow up appointments. Patient verbalized understanding of instructions.   Zacarias Pontes, CMA

## 2021-08-09 NOTE — Telephone Encounter (Signed)
See note

## 2021-08-10 LAB — URINALYSIS, ROUTINE W REFLEX MICROSCOPIC
Bilirubin Urine: NEGATIVE
Glucose, UA: NEGATIVE
Hgb urine dipstick: NEGATIVE
Leukocytes,Ua: NEGATIVE
Nitrite: NEGATIVE
Protein, ur: NEGATIVE
Specific Gravity, Urine: 1.022 (ref 1.001–1.035)
pH: 5.5 (ref 5.0–8.0)

## 2021-08-13 LAB — HEPATITIS C ANTIBODY
Hepatitis C Ab: NONREACTIVE
SIGNAL TO CUT-OFF: 0.07 (ref <1.00)

## 2021-08-13 LAB — CORTISOL-AM, BLOOD: Cortisol - AM: 23.4 ug/dL — ABNORMAL HIGH

## 2021-08-23 ENCOUNTER — Ambulatory Visit (INDEPENDENT_AMBULATORY_CARE_PROVIDER_SITE_OTHER): Payer: Medicare Other | Admitting: Internal Medicine

## 2021-08-23 ENCOUNTER — Ambulatory Visit (INDEPENDENT_AMBULATORY_CARE_PROVIDER_SITE_OTHER): Payer: Medicare Other

## 2021-08-23 ENCOUNTER — Ambulatory Visit: Payer: Medicare Other

## 2021-08-23 ENCOUNTER — Encounter: Payer: Self-pay | Admitting: Internal Medicine

## 2021-08-23 VITALS — BP 110/80 | HR 77 | Temp 98.6°F | Resp 14 | Ht 62.5 in | Wt 209.0 lb

## 2021-08-23 DIAGNOSIS — M47812 Spondylosis without myelopathy or radiculopathy, cervical region: Secondary | ICD-10-CM | POA: Diagnosis not present

## 2021-08-23 DIAGNOSIS — M545 Low back pain, unspecified: Secondary | ICD-10-CM

## 2021-08-23 DIAGNOSIS — N3000 Acute cystitis without hematuria: Secondary | ICD-10-CM

## 2021-08-23 DIAGNOSIS — M542 Cervicalgia: Secondary | ICD-10-CM

## 2021-08-23 DIAGNOSIS — R809 Proteinuria, unspecified: Secondary | ICD-10-CM

## 2021-08-23 DIAGNOSIS — I10 Essential (primary) hypertension: Secondary | ICD-10-CM | POA: Diagnosis not present

## 2021-08-23 DIAGNOSIS — F419 Anxiety disorder, unspecified: Secondary | ICD-10-CM

## 2021-08-23 DIAGNOSIS — R1084 Generalized abdominal pain: Secondary | ICD-10-CM

## 2021-08-23 DIAGNOSIS — N1832 Chronic kidney disease, stage 3b: Secondary | ICD-10-CM | POA: Diagnosis not present

## 2021-08-23 DIAGNOSIS — Z1231 Encounter for screening mammogram for malignant neoplasm of breast: Secondary | ICD-10-CM

## 2021-08-23 DIAGNOSIS — R0600 Dyspnea, unspecified: Secondary | ICD-10-CM | POA: Diagnosis not present

## 2021-08-23 DIAGNOSIS — M109 Gout, unspecified: Secondary | ICD-10-CM | POA: Diagnosis not present

## 2021-08-23 DIAGNOSIS — M47816 Spondylosis without myelopathy or radiculopathy, lumbar region: Secondary | ICD-10-CM

## 2021-08-23 DIAGNOSIS — R7989 Other specified abnormal findings of blood chemistry: Secondary | ICD-10-CM

## 2021-08-23 DIAGNOSIS — R011 Cardiac murmur, unspecified: Secondary | ICD-10-CM

## 2021-08-23 DIAGNOSIS — E2839 Other primary ovarian failure: Secondary | ICD-10-CM | POA: Diagnosis not present

## 2021-08-23 MED ORDER — ALLOPURINOL 100 MG PO TABS: 50.0000 mg | ORAL_TABLET | Freq: Every day | ORAL | 3 refills | Status: DC

## 2021-08-23 MED ORDER — ESCITALOPRAM OXALATE 10 MG PO TABS: 10.0000 mg | ORAL_TABLET | Freq: Every day | ORAL | 3 refills | Status: DC

## 2021-08-23 MED ORDER — COLCHICINE 0.6 MG PO TABS: ORAL_TABLET | ORAL | 5 refills | Status: DC

## 2021-08-23 MED ORDER — CARVEDILOL 12.5 MG PO TABS: 12.5000 mg | ORAL_TABLET | Freq: Two times a day (BID) | ORAL | 3 refills | Status: DC

## 2021-08-23 NOTE — Patient Instructions (Addendum)
Dr. Pearson Grippe  [facility] [place],  [place]  [id number] Get Driving Directions Main: [phone]  Allopurinol Tablets What is this medication? ALLOPURINOL (al oh PURE i nole) treats gout or kidney stones. It may also be used to prevent high uric acid levels after chemotherapy. It works by decreasing uric acid levels in your body. This medicine may be used for other purposes; ask your health care provider or pharmacist if you have questions. COMMON BRAND NAME(S): Zyloprim What should I tell my care team before I take this medication? They need to know if you have any of these conditions: Kidney disease Liver disease An unusual or allergic reaction to allopurinol, other medications, foods, dyes, or preservatives Pregnant or trying to get pregnant Breast-feeding How should I use this medication? Take this medication by mouth with a glass of water. Follow the directions on the prescription label. If this medication upsets your stomach, take it with food or milk. Take your doses at regular intervals. Do not take your medication more often than directed. Talk to your care team regarding the use of this medication in children. Special care may be needed. While this medication may be prescribed for children as young as 6 years for selected conditions, precautions do apply. Overdosage: If you think you have taken too much of this medicine contact a poison control center or emergency room at once. NOTE: This medicine is only for you. Do not share this medicine with others. What if I miss a dose? If you miss a dose, take it as soon as you can. If it is almost time for your next dose, take only that dose. Do not take double or extra doses. What may interact with this medication? Do not take this medication with the following: Didanosine, ddI This medication may also interact with the following: Certain antibiotics like amoxicillin, ampicillin Certain medications for  cancer Certain medications for immunosuppression like azathioprine, cyclosporine, mercaptopurine Chlorpropamide Probenecid Sulfinpyrazone Thiazide diuretics, like hydrochlorothiazide Warfarin This list may not describe all possible interactions. Give your health care provider a list of all the medicines, herbs, non-prescription drugs, or dietary supplements you use. Also tell them if you smoke, drink alcohol, or use illegal drugs. Some items may interact with your medicine. What should I watch for while using this medication? Visit your care team for regular checks on your progress. If you are taking this medication to treat gout, you may not have less frequent attacks at first. Keep taking your medication regularly and the attacks should get better within 2 to 6 weeks. Drink plenty of water (10 to 12 full glasses a day) while you are taking this medication. This will help to reduce stomach upset and reduce the risk of getting gout or kidney stones. Call your care team at once if you get a skin rash together with chills, fever, sore throat, or nausea and vomiting, if you have blood in your urine, or difficulty passing urine. This medication may cause serious skin reactions. They can happen weeks to months after starting the medication. Contact your care team right away if you notice fevers or flu-like symptoms with a rash. The rash may be red or purple and then turn into blisters or peeling of the skin. Or, you might notice a red rash with swelling of the face, lips or lymph nodes in your neck or under your arms. Do not take vitamin C without asking your care team. Too much vitamin C can increase the chance of  getting kidney stones. You may get drowsy or dizzy. Do not drive, use machinery, or do anything that needs mental alertness until you know how this medication affects you. Do not stand or sit up quickly, especially if you are an older patient. This reduces the risk of dizzy or fainting spells.  Alcohol can make you more drowsy and dizzy. Alcohol can also increase the chance of stomach problems and increase the amount of uric acid in your blood. Avoid alcoholic drinks. What side effects may I notice from receiving this medication? Side effects that you should report to your care team as soon as possible: Allergic reactions--skin rash, itching, hives, swelling of the face, lips, tongue, or throat Kidney injury--decrease in the amount of urine, swelling of the ankles, hands, or feet Liver injury--right upper belly pain, loss of appetite, nausea, light-colored stool, dark yellow or brown urine, yellowing skin or eyes, unusual weakness or fatigue Rash, fever, and swollen lymph nodes Redness, blistering, peeling, or loosening of the skin, including inside the mouth Side effects that usually do not require medical attention (report to your care team if they continue or are bothersome): Diarrhea Drowsiness Nausea Vomiting This list may not describe all possible side effects. Call your doctor for medical advice about side effects. You may report side effects to FDA at 1-800-FDA-1088. Where should I keep my medication? Keep out of the reach of children and pets. Store at room temperature between 15 and 25 degrees C (59 and 77 degrees F). Protect from light and moisture. Throw away any unused medication after the expiration date. NOTE: This sheet is a summary. It may not cover all possible information. If you have questions about this medicine, talk to your doctor, pharmacist, or health care provider.  2023 Elsevier/Gold Standard (2020-04-11 00:00:00)  Preventing Chronic Kidney Disease Chronic kidney disease (CKD) occurs when the kidneys are slowly and permanently damaged over a long period of time. The kidneys are two organs that do many important jobs in the body, including: Removing waste and extra fluid from the blood to make urine. Making hormones that maintain the amount of fluid in  tissues and blood vessels. Maintaining the right amount of fluids and electrolytes in the body. A small amount of kidney damage may not cause problems, but a large amount of damage may make it hard or impossible for the kidneys to work the way they should. CKD gets worse over time. You can take steps to prevent CKD or to keep it from getting worse. The best way to prevent kidney damage is to know your risk factors and make changes before you develop symptoms of CKD. How can this condition affect me? At first, you may not notice any signs or symptoms of CKD. Symptoms develop slowly and may not be obvious until the kidney damage becomes severe. If steps are not taken to prevent or slow down the disease, CKD can lead to: A low red blood cell count (anemia). Heart disease. Weak bones. Nerve damage. Stroke. Kidney failure and dialysis. Changes in urination, such as less urine, more urine, or blood in the urine. What can increase my risk? You are more likely to develop CKD if you: Are 71 years of age or older. Are obese. Have taken certain medicines for a long time. Use tobacco or have used it in the past. Have any of the following conditions: Diabetes. High blood pressure. Heart disease. Multiple myeloma. An autoimmune disease. Frequent urinary tract infections. Polycystic kidney disease. High cholesterol. Have a  family history of kidney disease, heart disease, diabetes, or high blood pressure. Have problems with urine flow that may be caused by: Cancer. Having kidney stones more than once. An enlarged prostate in males. What actions can I take to prevent CKD? Managing conditions that put you at risk Talk to your health care provider about your kidney health and your risk factors for CKD. Work with your health care provider to manage conditions such as high blood pressure, diabetes, or high cholesterol. This may involve taking medicines, eating healthy, or making lifestyle changes to  help get the following measures down to the target that your health care provider recommends: Blood pressure. Blood sugar (glucose) levels. Cholesterol. Eating and drinking  Follow instructions from your health care provider about diet. This may include: Limiting salt (sodium) intake. You should have less than 1 tsp (2,300 mg) of sodium a day. If you have heart disease or high blood pressure, you should have less than  tsp (1,725 mg) of sodium a day. Limiting protein intake as told by your health care provider. Avoid high-protein foods. Eating a balanced, heart-healthy diet. Avoiding foods that are high in potassium and phosphorous. Limit alcohol. If you drink alcohol: Limit how much you use to: 0-1 drink a day for women who are not pregnant. 0-2 drinks a day for men. Know how much alcohol is in your drink. In the [place] one drink equals one 12 oz bottle of beer (355 mL), one 5 oz glass of wine (148 mL), or one 1 oz glass of hard liquor (44 mL). If you have diabetes, work with a Financial planner (Firefighter) or a certified diabetes educator to develop a healthy eating plan. Talk with your health care provider about how much fluid you should drink each day. Lifestyle  Exercise for at least 30 minutes on 5 or more days of the week, or as much as told by your health care provider. Keep your weight at a healthy level. If you are overweight or obese, lose weight as told by your health care provider. Do not use any products that contain nicotine or tobacco, such as cigarettes, e-cigarettes, and chewing tobacco. If you need help quitting, ask your health care provider. General instructions Take over-the-counter and prescription medicines only as told by your health care provider. Do not take any new medicines unless approved by your health care provider. Use NSAIDs, such as ibuprofen, for pain only when necessary. Ask your health care provider about other pain medicines that do not  increase your risk of developing CKD. Have a yearly physical exam. Learn about your family's medical history. Talk to your relatives and siblings about diabetes, heart disease, and high blood pressure. Where to find more information Learn more about CKD and how to prevent CKD from: [place]: www.kidney.org American Association of Kidney Patients: BombTimer.gl American Diabetes Association: www.diabetes.org Summary Symptoms of CKD develop slowly and may not be obvious until the kidney damage becomes severe. The best way to prevent kidney damage is to know your risk factors and make nutrition and lifestyle changes before you develop symptoms of CKD. Follow instructions from your health care provider about diet, which may include limiting how much salt, protein, and alcohol you consume. Work with your health care provider to keep your blood pressure, cholesterol, and blood sugar levels within the recommended range. This information is not intended to replace advice given to you by your health care provider. Make sure you discuss any questions you have with your  health care provider. Document Revised: 06/30/2019 Document Reviewed: 06/03/2019 Elsevier Patient Education  [place] for Chronic Kidney Disease Chronic kidney disease (CKD) occurs when the kidneys are permanently damaged over a long period of time. When your kidneys are not working well, they cannot remove waste, fluids, and other substances from your blood as well as they did before. The substances can build up, which can worsen kidney damage and affect how your body functions. Certain foods lead to a buildup of these substances. By changing your diet, you can help prevent more kidney damage and delay or prevent the need for dialysis. What are tips for following this plan? Reading food labels Check the amount of salt (sodium) in foods. Choose foods that have less than 300 milligrams (mg) per  serving. Check the ingredient list for phosphorus or potassium-based additives or preservatives. Check the amount of saturated fat and trans fat. Limit or avoid these fats as told by your dietitian. Shopping Avoid buying foods that are: Processed or prepackaged. Calcium-enriched or that have calcium added to them (are fortified). Do not buy foods that have salt or sodium listed among the first five ingredients. Buy canned vegetables and beans that say "no salt added" or "low sodium" and rinse them before eating. Cooking Soak vegetables, such as potatoes, before cooking to reduce potassium. To do this: Peel and cut the vegetables into small pieces. Soak the vegetables in warm water for at least 2 hours. For every 1 cup of vegetables, use 10 cups of water. Drain and rinse the vegetables with warm water. Boil the vegetables for at least 5 minutes. Meal planning Limit the amount of protein you eat from plant and animal sources each day. Do not add salt to food when cooking or before eating. Eat meals and snacks at around the same time each day. General information Talk with your health care provider about whether you should take a vitamin and mineral supplement. Use standard measuring cups and spoons to measure servings of foods. Use a kitchen scale to measure portions of protein foods. If told by your health care provider, avoid drinking too much fluid. Measure and count all liquids, including water, ice, soups, flavored gelatin, and frozen desserts such as ice pops or ice cream. If you have diabetes: If you have diabetes (diabetes mellitus) and CKD, it is important to keep your blood sugar (glucose) in the target range recommended by your health care provider. Follow your diabetes management plan. This may include: Checking your blood glucose regularly. Taking medicines by mouth, taking insulin, or taking both. Exercising for at least 30 minutes on 5 or more days each week, or as told by  your health care provider. Tracking how many servings of carbohydrates you eat at each meal. You may be given specific guidelines on how much of certain foods and nutrients you may eat, depending on your stage of kidney disease and whether you have high blood pressure (hypertension). Follow your meal plan as told by your dietitian. What nutrients should I limit? Work with your health care provider and dietitian to develop a meal plan that is right for you. Foods you can eat and foods you should limit or avoid will depend on the stage of your kidney disease and any other health conditions you have. The items listed below are not a complete list. Talk with your dietitian about what dietary choices are best for you. Potassium Potassium affects how steadily your heart beats. If too much potassium  builds up in your blood, the potassium can cause an irregular heartbeat or even a heart attack. You may need to limit or avoid foods that are high in potassium, such as: Milk and soy milk. Fruits, such as bananas, apricots, nectarines, melon, prunes, raisins, kiwi, and oranges. Vegetables, such as potatoes, sweet potatoes, yams, tomatoes, leafy greens, beets, avocado, pumpkin, and winter squash. White and lima beans. Whole-wheat breads and pastas. Beans and nuts. Phosphorus Phosphorus is a mineral found in your bones. A balance between calcium and phosphorus is needed to build and maintain healthy bones. Too much phosphorus pulls calcium from your bones. This can make your bones weak and more likely to break. Too much phosphorus can also make your skin itch. You may need to limit or avoid foods that are high in phosphorus, such as: Milk and dairy products. Dried beans and peas. Tofu, soy milk, and other soy-based meat replacements. Dark-colored sodas. Nuts and peanut butter. Meat, poultry, and fish. Bran cereals and oatmeal. Protein  Protein helps you make and keep muscle. It also helps to repair your  body's cells and tissues. One of the natural breakdown products of protein is a waste product called urea. When your kidneys are not working properly, they cannot remove wastes, such as urea. Reducing how much protein you eat can help prevent a buildup of urea in your blood. Depending on your stage of kidney disease, you may need to limit foods that are high in protein. Sources of animal protein include: Meat (all types). Fish and seafood. Poultry. Eggs. Dairy. Other protein foods include: Beans and legumes. Nuts and nut butter. Soy and tofu.  Sodium Sodium helps to maintain a healthy balance of fluids in your body. Too much sodium can increase your blood pressure and have a negative effect on your heart and lungs. Too much sodium can also cause your body to retain too much fluid, making your kidneys work harder. Most people should have less than 2,300 mg of sodium each day. If you have hypertension, you may need to limit your sodium to 1,500 mg each day. You may need to limit or avoid foods that are high in sodium, such as: Salt seasonings. Soy sauce. Cured and processed meats. Salted crackers and snack foods. Fast food. Canned soups and most canned foods. Pickled foods. Vegetable juice. Boxed mixes or ready-to-eat boxed meals and side dishes. Bottled dressings, sauces, and marinades. Talk with your dietitian about how much potassium, phosphorus, protein, and sodium you may have each day. Summary Chronic kidney disease (CKD) can lead to a buildup of waste and extra substances in the body. Certain foods lead to a buildup of these substances. By changing your diet as told, you can help prevent more kidney damage and delay or prevent the need for dialysis. Food intake changes are different for each person with CKD. Work with a dietitian to set up nutrient goals and a meal plan that is right for you. If you have diabetes and CKD, it is important to keep your blood sugar in the target range  recommended by your health care provider. This information is not intended to replace advice given to you by your health care provider. Make sure you discuss any questions you have with your health care provider. Document Revised: 06/27/2019 Document Reviewed: 06/27/2019 Elsevier Patient Education  [place].  Chronic Kidney Disease, Adult Chronic kidney disease (CKD) occurs when the kidneys are slowly and permanently damaged over a long period of time. The kidneys are  a pair of organs that do many important jobs in the body, including: Removing waste and extra fluid from the blood to make urine. Making hormones that maintain the amount of fluid in tissues and blood vessels. Maintaining the right amount of fluids and chemicals in the body. A small amount of kidney damage may not cause problems, but a large amount of damage may make it hard or impossible for the kidneys to work right. Steps must be taken to slow kidney damage or to stop it from getting worse. If steps are not taken, the kidneys may stop working permanently (end-stage renal disease, or ESRD). Most of the time, CKD does not go away, but it can often be controlled. People who have CKD are usually able to live full lives. What are the causes? The most common causes of this condition are diabetes and high blood pressure (hypertension). Other causes include: Cardiovascular diseases. These affect the heart and blood vessels. Kidney diseases. These include: Glomerulonephritis, or inflammation of the tiny filters in the kidneys. Interstitial nephritis. This is swelling of the small tubes of the kidneys and of the surrounding structures. Polycystic kidney disease, in which clusters of fluid-filled sacs form within the kidneys. Renal vascular disease. This includes disorders that affect the arteries and veins of the kidneys. Diseases that affect the body's defense system (immune system). A problem with urine flow. This may be  caused by: Kidney stones. Cancer. An enlarged prostate, in males. A kidney infection or urinary tract infection (UTI) that keeps coming back. Vasculitis. This is swelling or inflammation of the blood vessels. What increases the risk? Your chances of having kidney disease increase with age. The following factors may make you more likely to develop this condition: A family history of kidney disease or kidney failure. Kidney failure means the kidneys can no longer work right. Certain genetic diseases. Taking medicines often that are damaging to the kidneys. Being around or being in contact with toxic substances. Obesity. A history of tobacco use. What are the signs or symptoms? Symptoms of this condition include: Feeling very tired (lethargic) and having less energy. Swelling, or edema, of the face, legs, ankles, or feet. Nausea or vomiting, or loss of appetite. Confusion or trouble concentrating. Muscle twitches and cramps, especially in the legs. Dry, itchy skin. A metallic taste in the mouth. Producing less urine, or producing more urine (especially at night). Shortness of breath. Trouble sleeping. CKD may also result in not having enough red blood cells or hemoglobin in the blood (anemia) or having weak bones (bone disease). Symptoms develop slowly and may not be obvious until the kidney damage becomes severe. It is possible to have kidney disease for years without having symptoms. How is this diagnosed? This condition may be diagnosed based on: Blood tests. Urine tests. Imaging tests, such as an ultrasound or a CT scan. A kidney biopsy. This involves removing a sample of kidney tissue to be looked at under a microscope. Results from these tests will help to determine how serious the CKD is. How is this treated? There is no cure for most cases of this condition, but treatment usually relieves symptoms and prevents or slows the worsening of the disease. Treatment may  include: Diet changes, which may require you to avoid alcohol and foods that are high in salt, potassium, phosphorous, and protein. Medicines. These may: Lower blood pressure. Control blood sugar (glucose). Relieve anemia. Relieve swelling. Protect your bones. Improve the balance of salts and minerals in your blood (electrolytes).  Dialysis, which is a type of treatment that removes toxic waste from the body. It may be needed if you have kidney failure. Managing any other conditions that are causing your CKD or making it worse. Follow these instructions at home: Medicines Take over-the-counter and prescription medicines only as told by your health care provider. The amount of some medicines that you take may need to be changed. Do not take any new medicines unless approved by your health care provider. Many medicines can make kidney damage worse. Do not take any vitamin and mineral supplements unless approved by your health care provider. Many nutritional supplements can make kidney damage worse. Lifestyle  Do not use any products that contain nicotine or tobacco, such as cigarettes, e-cigarettes, and chewing tobacco. If you need help quitting, ask your health care provider. If you drink alcohol: Limit how much you use to: 0-1 drink a day for women who are not pregnant. 0-2 drinks a day for men. Know how much alcohol is in your drink. In the [place] one drink equals one 12 oz bottle of beer (355 mL), one 5 oz glass of wine (148 mL), or one 1 oz glass of hard liquor (44 mL). Maintain a healthy weight. If you need help, ask your health care provider. General instructions  Follow instructions from your health care provider about eating or drinking restrictions, including any prescribed diet. Track your blood pressure at home. Report changes in your blood pressure as told. If you are being treated for diabetes, track your blood glucose levels as told. Start or continue an exercise plan.  Exercise at least 30 minutes a day, 5 days a week. Keep your immunizations up to date as told. Keep all follow-up visits. This is important. Where to find more information American Association of Kidney Patients: BombTimer.gl National Kidney Foundation: www.kidney.[place]: https://mathis.com/ Life Options: www.lifeoptions.org Kidney School: www.kidneyschool.org Contact a health care provider if: Your symptoms get worse. You develop new symptoms. Get help right away if: You develop symptoms of ESRD. These include: Headaches. Numbness in your hands or feet. Easy bruising. Frequent hiccups. Chest pain. Shortness of breath. Lack of menstrual periods, in women. You have a fever. You are producing less urine than usual. You have pain or bleeding when you urinate or when you have a bowel movement. These symptoms may represent a serious problem that is an emergency. Do not wait to see if the symptoms will go away. Get medical help right away. Call your local emergency services (911 in the [place] Do not drive yourself to the hospital. Summary Chronic kidney disease (CKD) occurs when the kidneys become damaged slowly over a long period of time. The most common causes of this condition are diabetes and high blood pressure (hypertension). There is no cure for most cases of CKD, but treatment usually relieves symptoms and prevents or slows the worsening of the disease. Treatment may include a combination of lifestyle changes, medicines, and dialysis. This information is not intended to replace advice given to you by your health care provider. Make sure you discuss any questions you have with your health care provider. Document Revised: 06/08/2019 Document Reviewed: 06/08/2019 Elsevier Patient Education  [place].  Pneumococcal Conjugate Vaccine (Prevnar 20) Suspension for Injection What is this medication? PNEUMOCOCCAL VACCINE (NEU mo KOK al vak SEEN) is a vaccine. It  prevents pneumococcus bacterial infections. These bacteria can cause serious infections like pneumonia, meningitis, and blood infections. This vaccine will not treat an infection and will  not cause infection. This vaccine is recommended for adults 18 years and older. This medicine may be used for other purposes; ask your health care provider or pharmacist if you have questions. COMMON BRAND NAME(S): Prevnar 20 What should I tell my care team before I take this medication? They need to know if you have any of these conditions: bleeding disorder fever immune system problems an unusual or allergic reaction to pneumococcal vaccine, diphtheria toxoid, other vaccines, other medicines, foods, dyes, or preservatives pregnant or trying to get pregnant breast-feeding How should I use this medication? This vaccine is injected into a muscle. It is given by a health care provider. A copy of Vaccine Information Statements will be given before each vaccination. Be sure to read this information carefully each time. This sheet may change often. Talk to your health care provider about the use of this medicine in children. Special care may be needed. Overdosage: If you think you have taken too much of this medicine contact a poison control center or emergency room at once. NOTE: This medicine is only for you. Do not share this medicine with others. What if I miss a dose? This does not apply. This medicine is not for regular use. What may interact with this medication? medicines for cancer chemotherapy medicines that suppress your immune function steroid medicines like prednisone or cortisone This list may not describe all possible interactions. Give your health care provider a list of all the medicines, herbs, non-prescription drugs, or dietary supplements you use. Also tell them if you smoke, drink alcohol, or use illegal drugs. Some items may interact with your medicine. What should I watch for while using  this medication? Mild fever and pain should go away in 3 days or less. Report any unusual symptoms to your health care provider. What side effects may I notice from receiving this medication? Side effects that you should report to your doctor or health care professional as soon as possible: allergic reactions (skin rash, itching or hives; swelling of the face, lips, or tongue) confusion fast, irregular heartbeat fever over 102 degrees F muscle weakness seizures trouble breathing unusual bruising or bleeding Side effects that usually do not require medical attention (report to your doctor or health care professional if they continue or are bothersome): fever of 102 degrees F or less headache joint pain muscle cramps, pain pain, tender at site where injected This list may not describe all possible side effects. Call your doctor for medical advice about side effects. You may report side effects to FDA at 1-800-FDA-1088. Where should I keep my medication? This vaccine is only given by a health care provider. It will not be stored at home. NOTE: This sheet is a summary. It may not cover all possible information. If you have questions about this medicine, talk to your doctor, pharmacist, or health care provider.  2023 Elsevier/Gold Standard (2019-11-17 00:00:00)

## 2021-08-24 LAB — MICROALBUMIN / CREATININE URINE RATIO
Creatinine, Urine: 202 mg/dL (ref 20–275)
Microalb Creat Ratio: 31 mcg/mg creat — ABNORMAL HIGH (ref <30)
Microalb, Ur: 6.2 mg/dL

## 2021-08-24 LAB — URINE CULTURE
MICRO NUMBER:: 13506505
Result:: NO GROWTH
SPECIMEN QUALITY:: ADEQUATE

## 2021-08-24 LAB — SODIUM, URINE, RANDOM: Sodium, Ur: 23 mmol/L — ABNORMAL LOW (ref 28–272)

## 2021-08-25 ENCOUNTER — Encounter: Payer: Self-pay | Admitting: Internal Medicine

## 2021-08-25 ENCOUNTER — Other Ambulatory Visit: Payer: Self-pay | Admitting: Internal Medicine

## 2021-08-25 DIAGNOSIS — N1832 Chronic kidney disease, stage 3b: Secondary | ICD-10-CM

## 2021-08-25 DIAGNOSIS — I10 Essential (primary) hypertension: Secondary | ICD-10-CM

## 2021-08-25 DIAGNOSIS — I700 Atherosclerosis of aorta: Secondary | ICD-10-CM | POA: Insufficient documentation

## 2021-08-25 MED ORDER — AMLODIPINE BESYLATE 5 MG PO TABS: 5.0000 mg | ORAL_TABLET | Freq: Every day | ORAL | 3 refills | Status: DC

## 2021-08-26 ENCOUNTER — Telehealth: Payer: Self-pay

## 2021-08-26 NOTE — Telephone Encounter (Signed)
Lvm for pt to return call in regards to x-ray & lab results.

## 2021-08-27 ENCOUNTER — Encounter: Payer: Self-pay | Admitting: Internal Medicine

## 2021-08-27 DIAGNOSIS — M47816 Spondylosis without myelopathy or radiculopathy, lumbar region: Secondary | ICD-10-CM | POA: Insufficient documentation

## 2021-08-27 DIAGNOSIS — R809 Proteinuria, unspecified: Secondary | ICD-10-CM | POA: Insufficient documentation

## 2021-08-27 DIAGNOSIS — R7989 Other specified abnormal findings of blood chemistry: Secondary | ICD-10-CM | POA: Insufficient documentation

## 2021-08-27 DIAGNOSIS — M47812 Spondylosis without myelopathy or radiculopathy, cervical region: Secondary | ICD-10-CM | POA: Insufficient documentation

## 2021-08-27 LAB — BASIC METABOLIC PANEL
BUN/Creatinine Ratio: 19 (calc) (ref 6–22)
BUN: 34 mg/dL — ABNORMAL HIGH (ref 7–25)
CO2: 22 mmol/L (ref 20–32)
Calcium: 10.4 mg/dL (ref 8.6–10.4)
Chloride: 98 mmol/L (ref 98–110)
Creat: 1.79 mg/dL — ABNORMAL HIGH (ref 0.60–1.00)
Glucose, Bld: 89 mg/dL (ref 65–99)
Potassium: 4.3 mmol/L (ref 3.5–5.3)
Sodium: 135 mmol/L (ref 135–146)

## 2021-08-27 LAB — CREATININE WITH EST GFR: Creat: 1.79 mg/dL — ABNORMAL HIGH (ref 0.60–1.00)

## 2021-08-27 LAB — TEST AUTHORIZATION

## 2021-08-27 NOTE — Progress Notes (Addendum)
Chief Complaint  Patient presents with   Follow-up    Annual f/u, disc labs from 2 wks ago.   F/u  1. Labs indicate CKD 3B and prerenal on coreg 12.5 mg bid and candesartan-hctz 32-12.5 mg qd  2. Mood d/o refill of lexapro wants to increase to 10 mg had sister and brother pass recently  3. C/o mild neck and low back pain disc avoid nsaids  3. Labs elevated cortisol will do further w/u     Review of Systems  Constitutional:  Negative for weight loss.  HENT:  Negative for hearing loss.   Eyes:  Negative for blurred vision.  Respiratory:  Negative for shortness of breath.   Cardiovascular:  Negative for chest pain.  Gastrointestinal:  Negative for abdominal pain and blood in stool.  Genitourinary:  Negative for dysuria.  Musculoskeletal:  Negative for falls and joint pain.  Skin:  Negative for rash.  Neurological:  Negative for headaches.  Psychiatric/Behavioral:  Negative for depression.    Past Medical History:  Diagnosis Date   Allergy    mold and mildew    Anxiety    Arthritis    cervical disc degeneration Dr. Lanelle Bal    Febrile seizures [facility])    childhood x 2 stopped age 18 or 71 y.o    Heart murmur    Hypertension    Past Surgical History:  Procedure Laterality Date   BREAST ENHANCEMENT SURGERY     Family History  Problem Relation Age of Onset   Arthritis Mother    COPD Mother        smoker    Hyperlipidemia Mother    Hypertension Mother    Cancer Father        prostate died age 23    Early death Father    Kidney disease Sister        [place]   Stroke Sister    COPD Brother        smoker    Social History   Socioeconomic History   Marital status: Unknown    Spouse name: Not on file   Number of children: Not on file   Years of education: Not on file   Highest education level: Not on file  Occupational History   Not on file  Tobacco Use   Smoking status: Never   Smokeless tobacco: Never  Substance and Sexual Activity   Alcohol use:  Yes    Comment: rare    Drug use: Not Currently   Sexual activity: Not Currently  Other Topics Concern   Not on file  Social History Narrative   Divorced    2 years college    Works Biochemist, clinical    No guns, wears seat belt, safe in relationship    No kids    Has cats    Social Determinants of Radio broadcast assistant Strain: Not on file  Food Insecurity: Not on file  Transportation Needs: Not on file  Physical Activity: Not on file  Stress: Not on file  Social Connections: Not on file  Intimate Partner Violence: Not on file   Current Meds  Medication Sig   allopurinol (ZYLOPRIM) 100 MG tablet Take 0.5 tablets (50 mg total) by mouth daily.   candesartan-hydrochlorothiazide (ATACAND HCT) 32-12.5 MG tablet TAKE 1/2 TABLET BY MOUTH DAILY FOR 3 DAYS, THEN INCREASE TO 1 TABLET DAILY IN THE MORNING   Loratadine (CLARITIN PO) Take by mouth.   Omeprazole Magnesium (PRILOSEC PO) Take  by mouth.   [DISCONTINUED] carvedilol (COREG) 12.5 MG tablet TAKE 1/2 TABLET BY MOUTH TWICE A DAY FOR 3 DAYS WITH FOOD THEN TO 1 TAB TWICE A DAY STOP ATENOLOL   [DISCONTINUED] colchicine 0.6 MG tablet 2 pills x 1 then 0.6 mg 1 hour later max dose 1.8/24 hours do not repeat x 3 days   [DISCONTINUED] escitalopram (LEXAPRO) 5 MG tablet Take 1 tablet (5 mg total) by mouth daily. Overdue for labs call the office to schedule asap   Allergies  Allergen Reactions   Molds & Smuts Swelling   Recent Results (from the past 2160 hour(s))  Cortisol-am, blood     Status: Abnormal   Collection Time: 08/09/21 10:25 AM  Result Value Ref Range   Cortisol - AM 23.4 (H) mcg/dL    Comment: Reference Range 8 a.m. (7-9 a.m.) Specimen: 4.0-22.0 .   Hepatitis C antibody     Status: None   Collection Time: 08/09/21 10:25 AM  Result Value Ref Range   Hepatitis C Ab NON-REACTIVE NON-REACTIVE   SIGNAL TO CUT-OFF 0.07 <1.00    Comment: . HCV antibody was non-reactive. There is no laboratory  evidence of HCV  infection. . In most cases, no further action is required. However, if recent HCV exposure is suspected, a test for HCV RNA (test code 44628) is suggested. . For additional information please refer to http://education.questdiagnostics.com/faq/FAQ22v1 (This link is being provided for informational/ educational purposes only.) .   Uric acid     Status: Abnormal   Collection Time: 08/09/21 10:25 AM  Result Value Ref Range   Uric Acid, Serum 9.2 (H) 2.4 - 7.0 mg/dL  Vitamin M38     Status: None   Collection Time: 08/09/21 10:25 AM  Result Value Ref Range   Vitamin B-12 245 211 - 911 pg/mL  Vitamin D (25 hydroxy)     Status: None   Collection Time: 08/09/21 10:25 AM  Result Value Ref Range   VITD 43.84 30.00 - 100.00 ng/mL  Hemoglobin A1c     Status: None   Collection Time: 08/09/21 10:25 AM  Result Value Ref Range   Hgb A1c MFr Bld 5.2 4.6 - 6.5 %    Comment: Glycemic Control Guidelines for People with Diabetes:Non Diabetic:  <6%Goal of Therapy: <7%Additional Action Suggested:  >8%   Urinalysis, Routine w reflex microscopic     Status: Abnormal   Collection Time: 08/09/21 10:25 AM  Result Value Ref Range   Color, Urine YELLOW YELLOW   APPearance CLOUDY (A) CLEAR   Specific Gravity, Urine 1.022 1.001 - 1.035   pH 5.5 5.0 - 8.0   Glucose, UA NEGATIVE NEGATIVE   Bilirubin Urine NEGATIVE NEGATIVE   Ketones, ur TRACE (A) NEGATIVE   Hgb urine dipstick NEGATIVE NEGATIVE   Protein, ur NEGATIVE NEGATIVE   Nitrite NEGATIVE NEGATIVE   Leukocytes,Ua NEGATIVE NEGATIVE  TSH     Status: None   Collection Time: 08/09/21 10:25 AM  Result Value Ref Range   TSH 4.38 0.35 - 5.50 uIU/mL  CBC with Differential/Platelet     Status: None   Collection Time: 08/09/21 10:25 AM  Result Value Ref Range   WBC 6.9 4.0 - 10.5 K/uL   RBC 4.43 3.87 - 5.11 Mil/uL   Hemoglobin 12.9 12.0 - 15.0 g/dL   HCT 17.7 11.6 - 57.9 %   MCV 88.6 78.0 - 100.0 fl   MCHC 32.9 30.0 - 36.0 g/dL   RDW 03.8 33.3 - 83.2  %  Platelets 223.0 150.0 - 400.0 K/uL   Neutrophils Relative % 64.9 43.0 - 77.0 %   Lymphocytes Relative 22.2 12.0 - 46.0 %   Monocytes Relative 8.1 3.0 - 12.0 %   Eosinophils Relative 3.4 0.0 - 5.0 %   Basophils Relative 1.4 0.0 - 3.0 %   Neutro Abs 4.5 1.4 - 7.7 K/uL   Lymphs Abs 1.5 0.7 - 4.0 K/uL   Monocytes Absolute 0.6 0.1 - 1.0 K/uL   Eosinophils Absolute 0.2 0.0 - 0.7 K/uL   Basophils Absolute 0.1 0.0 - 0.1 K/uL  Lipid panel     Status: Abnormal   Collection Time: 08/09/21 10:25 AM  Result Value Ref Range   Cholesterol 215 (H) 0 - 200 mg/dL    Comment: ATP III Classification       Desirable:  < 200 mg/dL               Borderline High:  200 - 239 mg/dL          High:  > = 240 mg/dL   Triglycerides 88.0 0.0 - 149.0 mg/dL    Comment: Normal:  <150 mg/dLBorderline High:  150 - 199 mg/dL   HDL 61.10 >39.00 mg/dL   VLDL 17.6 0.0 - 40.0 mg/dL   LDL Cholesterol 136 (H) 0 - 99 mg/dL   Total CHOL/HDL Ratio 4     Comment:                Men          Women1/2 Average Risk     3.4          3.3Average Risk          5.0          4.42X Average Risk          9.6          7.13X Average Risk          15.0          11.0                       NonHDL 153.58     Comment: NOTE:  Non-HDL goal should be 30 mg/dL higher than patient's LDL goal (i.e. LDL goal of < 70 mg/dL, would have non-HDL goal of < 100 mg/dL)  Comprehensive metabolic panel     Status: Abnormal   Collection Time: 08/09/21 10:25 AM  Result Value Ref Range   Sodium 140 135 - 145 mEq/L   Potassium 4.3 3.5 - 5.1 mEq/L   Chloride 102 96 - 112 mEq/L   CO2 24 19 - 32 mEq/L   Glucose, Bld 89 70 - 99 mg/dL   BUN 26 (H) 6 - 23 mg/dL   Creatinine, Ser 1.41 (H) 0.40 - 1.20 mg/dL   Total Bilirubin 0.7 0.2 - 1.2 mg/dL   Alkaline Phosphatase 52 39 - 117 U/L   AST 14 0 - 37 U/L   ALT 10 0 - 35 U/L   Total Protein 6.8 6.0 - 8.3 g/dL   Albumin 4.4 3.5 - 5.2 g/dL   GFR 37.29 (L) >60.00 mL/min    Comment: Calculated using the CKD-EPI  Creatinine Equation (2021)   Calcium 9.9 8.4 - 10.5 mg/dL  Sodium, urine, random     Status: Abnormal   Collection Time: 08/23/21  4:28 PM  Result Value Ref Range   Sodium, Ur 23 (L) 28 - 272 mmol/L  Urine Culture  Status: None   Collection Time: 08/23/21  4:28 PM   Specimen: Urine  Result Value Ref Range   MICRO NUMBER: [id number]    SPECIMEN QUALITY: Adequate    Sample Source URINE    STATUS: FINAL    Result: No Growth   Basic Metabolic Panel (BMET)     Status: Abnormal   Collection Time: 08/23/21  4:28 PM  Result Value Ref Range   Glucose, Bld 89 65 - 99 mg/dL    Comment: .            Fasting reference interval .    BUN 34 (H) 7 - 25 mg/dL   Creat 1.79 (H) 0.60 - 1.00 mg/dL   BUN/Creatinine Ratio 19 6 - 22 (calc)   Sodium 135 135 - 146 mmol/L   Potassium 4.3 3.5 - 5.3 mmol/L   Chloride 98 98 - 110 mmol/L   CO2 22 20 - 32 mmol/L   Calcium 10.4 8.6 - 10.4 mg/dL  Microalbumin / creatinine urine ratio     Status: Abnormal   Collection Time: 08/23/21  4:56 PM  Result Value Ref Range   Creatinine, Urine 202 20 - 275 mg/dL   Microalb, Ur 6.2 mg/dL    Comment: Reference Range Not established    Microalb Creat Ratio 31 (H) <30 mcg/mg creat    Comment: . The ADA defines abnormalities in albumin excretion as follows: Marland Kitchen Albuminuria Category        Result (mcg/mg creatinine) . Normal to Mildly increased   <30 Moderately increased         30-299  Severely increased           > OR = 300 . The ADA recommends that at least two of three specimens collected within a 3-6 month period be abnormal before considering a patient to be within a diagnostic category.    Objective  Body mass index is 37.62 kg/m. Wt Readings from Last 3 Encounters:  08/23/21 209 lb (94.8 kg)  01/11/21 220 lb (99.8 kg)  05/25/19 220 lb (99.8 kg)   Temp Readings from Last 3 Encounters:  08/23/21 98.6 F (37 C) (Oral)  10/20/17 98.6 F (37 C) (Oral)   BP Readings from Last 3 Encounters:   08/23/21 110/80  08/09/21 134/82  01/11/21 (!) 161/93   Pulse Readings from Last 3 Encounters:  08/23/21 77  08/09/21 74  01/11/21 78    Physical Exam Vitals and nursing note reviewed.  Constitutional:      Appearance: Normal appearance. She is well-developed and well-groomed.  HENT:     Head: Normocephalic and atraumatic.  Eyes:     Conjunctiva/sclera: Conjunctivae normal.     Pupils: Pupils are equal, round, and reactive to light.  Cardiovascular:     Rate and Rhythm: Normal rate and regular rhythm.     Heart sounds: Murmur heard.  Pulmonary:     Effort: Pulmonary effort is normal.     Breath sounds: Normal breath sounds.  Abdominal:     General: Abdomen is flat. Bowel sounds are normal.     Tenderness: There is no abdominal tenderness.  Musculoskeletal:        General: No tenderness.  Skin:    General: Skin is warm and dry.  Neurological:     General: No focal deficit present.     Mental Status: She is alert and oriented to person, place, and time. Mental status is at baseline.     Cranial Nerves: Cranial nerves 2-12  are intact.     Motor: Motor function is intact.     Coordination: Coordination is intact.     Gait: Gait is intact.  Psychiatric:        Attention and Perception: Attention and perception normal.        Mood and Affect: Mood and affect normal.        Speech: Speech normal.        Behavior: Behavior normal. Behavior is cooperative.        Thought Content: Thought content normal.        Cognition and Memory: Cognition and memory normal.        Judgment: Judgment normal.     Assessment  Plan  Acute midline low back pain without sciatica - Plan: DG Lumbar Spine Complete -refer to [facility] and [facility]   Cervicalgia - Plan: DG Cervical Spine Complete IMPIMPRESSION: 1. Multilevel lumbar spine degenerative change, as described above. 2.  Aortic Atherosclerosis (ICD10-I70.0).     Electronically Signed   By: Michaelle Birks M.D.   On:  08/25/2021 14:28  IMPRESSION: Multilevel cervical spine degenerative change, as described above.     Electronically Signed   By: Michaelle Birks M.D.   On: 08/25/2021 14:36   Anxiety - Plan: escitalopram (LEXAPRO) 10 MG tablet  Stage 3b chronic kidney disease ([place]) +HTN controlled today - Plan: Sodium, urine, random, Ambulatory referral to Nephrology Dr. Joelyn Oms Labs pre-renal  Add norvasc 5 mg qd to coreg 12.5 mg bid  Hold candesartan -hctz was taking 1 32-12.5 mg qd    Gout, unspecified cause, unspecified chronicity, unspecified site - Plan: colchicine 0.6 MG tablet, allopurinol (ZYLOPRIM) 100 MG tablet    Elevated cortisol, htn,ab pain  Ct adrenals protocol to evaluate  HM  Covid had 2 pfizer  No flu  No Tdap No prevnar 20  Hep C negative  Dexa and mammo ordered rec call and schedule  Colonoscopy none on file will get back with me for now declines  Rec healthy diet and exrecise    Provider: Dr. Olivia Mackie McLean-Scocuzza-[facility] Medicine

## 2021-08-27 NOTE — Addendum Note (Signed)
Addended by: Quentin Ore on: 08/27/2021 01:44 PM   Modules accepted: Orders

## 2021-09-03 ENCOUNTER — Telehealth: Payer: Self-pay | Admitting: Internal Medicine

## 2021-09-03 NOTE — Telephone Encounter (Signed)
Patient called and would like a call back from referrals. She would like to speak to referrals about a local specialist.

## 2021-09-06 ENCOUNTER — Ambulatory Visit
Admission: RE | Admit: 2021-09-06 | Discharge: 2021-09-06 | Disposition: A | Discharge status: Home / Self Care | Payer: Medicare Other | Source: Ambulatory Visit | Attending: Internal Medicine | Admitting: Internal Medicine

## 2021-09-06 DIAGNOSIS — R1084 Generalized abdominal pain: Secondary | ICD-10-CM

## 2021-09-06 DIAGNOSIS — I10 Essential (primary) hypertension: Secondary | ICD-10-CM | POA: Diagnosis not present

## 2021-09-06 DIAGNOSIS — M47816 Spondylosis without myelopathy or radiculopathy, lumbar region: Secondary | ICD-10-CM | POA: Insufficient documentation

## 2021-09-06 DIAGNOSIS — K8020 Calculus of gallbladder without cholecystitis without obstruction: Secondary | ICD-10-CM | POA: Insufficient documentation

## 2021-09-06 DIAGNOSIS — I129 Hypertensive chronic kidney disease with stage 1 through stage 4 chronic kidney disease, or unspecified chronic kidney disease: Secondary | ICD-10-CM | POA: Insufficient documentation

## 2021-09-06 DIAGNOSIS — R011 Cardiac murmur, unspecified: Secondary | ICD-10-CM | POA: Diagnosis not present

## 2021-09-06 DIAGNOSIS — R7989 Other specified abnormal findings of blood chemistry: Secondary | ICD-10-CM

## 2021-09-06 DIAGNOSIS — N281 Cyst of kidney, acquired: Secondary | ICD-10-CM | POA: Insufficient documentation

## 2021-09-06 DIAGNOSIS — K7689 Other specified diseases of liver: Secondary | ICD-10-CM | POA: Diagnosis not present

## 2021-09-06 DIAGNOSIS — N1832 Chronic kidney disease, stage 3b: Secondary | ICD-10-CM | POA: Insufficient documentation

## 2021-09-06 DIAGNOSIS — G894 Chronic pain syndrome: Secondary | ICD-10-CM | POA: Diagnosis not present

## 2021-09-06 DIAGNOSIS — R0609 Other forms of dyspnea: Secondary | ICD-10-CM | POA: Diagnosis not present

## 2021-09-06 DIAGNOSIS — R0600 Dyspnea, unspecified: Secondary | ICD-10-CM

## 2021-09-06 LAB — ECHOCARDIOGRAM COMPLETE
AR max vel: 2.59 cm2
AV Area VTI: 2.49 cm2
AV Area mean vel: 2.47 cm2
AV Mean grad: 5 mmHg
AV Peak grad: 8.8 mmHg
Ao pk vel: 1.49 m/s
Area-P 1/2: 3.3 cm2
S' Lateral: 2.3 cm

## 2021-09-09 ENCOUNTER — Telehealth: Payer: Self-pay

## 2021-09-09 NOTE — Telephone Encounter (Signed)
Patient returned office phone call. Note from Dr French Ana about CT scan read, Patient understood note.

## 2021-09-10 ENCOUNTER — Encounter: Payer: Self-pay | Admitting: Internal Medicine

## 2021-09-10 DIAGNOSIS — N281 Cyst of kidney, acquired: Secondary | ICD-10-CM | POA: Insufficient documentation

## 2021-09-10 DIAGNOSIS — K7689 Other specified diseases of liver: Secondary | ICD-10-CM | POA: Insufficient documentation

## 2021-09-27 ENCOUNTER — Other Ambulatory Visit: Payer: Self-pay | Admitting: Internal Medicine

## 2021-09-27 DIAGNOSIS — F419 Anxiety disorder, unspecified: Secondary | ICD-10-CM

## 2021-10-02 ENCOUNTER — Encounter: Payer: Self-pay | Admitting: Internal Medicine

## 2021-10-02 ENCOUNTER — Telehealth (INDEPENDENT_AMBULATORY_CARE_PROVIDER_SITE_OTHER): Payer: Medicare Other | Admitting: Internal Medicine

## 2021-10-02 VITALS — Temp 99.2°F

## 2021-10-02 DIAGNOSIS — R6883 Chills (without fever): Secondary | ICD-10-CM

## 2021-10-02 DIAGNOSIS — H6983 Other specified disorders of Eustachian tube, bilateral: Secondary | ICD-10-CM | POA: Diagnosis not present

## 2021-10-02 DIAGNOSIS — H9203 Otalgia, bilateral: Secondary | ICD-10-CM | POA: Diagnosis not present

## 2021-10-02 DIAGNOSIS — R6889 Other general symptoms and signs: Secondary | ICD-10-CM

## 2021-10-02 DIAGNOSIS — J0140 Acute pansinusitis, unspecified: Secondary | ICD-10-CM | POA: Diagnosis not present

## 2021-10-02 DIAGNOSIS — R051 Acute cough: Secondary | ICD-10-CM | POA: Diagnosis not present

## 2021-10-02 DIAGNOSIS — M791 Myalgia, unspecified site: Secondary | ICD-10-CM | POA: Diagnosis not present

## 2021-10-02 DIAGNOSIS — R509 Fever, unspecified: Secondary | ICD-10-CM | POA: Diagnosis not present

## 2021-10-02 MED ORDER — AZITHROMYCIN 250 MG PO TABS: ORAL_TABLET | ORAL | 0 refills | Status: AC

## 2021-10-02 NOTE — Patient Instructions (Signed)

## 2021-10-02 NOTE — Progress Notes (Signed)
Telephone Note  I connected with Patricia [name]   on 10/02/21 at 10:40 AM EDT by telephone application and verified that I am speaking with the correct person using two identifiers.  Location patient: [place] Location provider:work or home office Persons participating in the virtual visit: patient, provider  I discussed the limitations and requested verbal permission for telemedicine visit. The patient expressed understanding and agreed to proceed.   HPI:  Acute telemedicine visit for : Sick visit  10 days ago started with nasal congestion and pnd No nausea, low appetite, +pnd, sinusitis, pnd cant cough up or get down, h/a body aches, had and fever to 103 and flu like sxs covid negative at home fever had x 10 days, denies sob, some cough taking coricidan otc  Temp down to 99 F   -Pertinent past medical history: see below -Pertinent medication allergies: Allergies  Allergen Reactions   Molds & Smuts Swelling   -COVID-19 vaccine status:  Immunization History  Administered Date(s) Administered   PFIZER(Purple Top)SARS-COV-2 Vaccination 05/13/2019     ROS: See pertinent positives and negatives per HPI.  Past Medical History:  Diagnosis Date   Allergy    mold and mildew    Anxiety    Arthritis    cervical disc degeneration Dr. Pablo Ledger    Febrile seizures [facility])    childhood x 2 stopped age 71 or 71 y.o    Heart murmur    Hypertension     Past Surgical History:  Procedure Laterality Date   BREAST ENHANCEMENT SURGERY       Current Outpatient Medications:    allopurinol (ZYLOPRIM) 100 MG tablet, Take 0.5 tablets (50 mg total) by mouth daily., Disp: 90 tablet, Rfl: 3   amLODipine (NORVASC) 5 MG tablet, Take 1 tablet (5 mg total) by mouth daily. Stop candasartan hctz 32-12.5 for now, Disp: 90 tablet, Rfl: 3   azithromycin (ZITHROMAX) 250 MG tablet, With food Take 2 tablets on day 1, then 1 tablet daily on days 2 through 5, Disp: 6 tablet, Rfl: 0    candesartan-hydrochlorothiazide (ATACAND HCT) 32-12.5 MG tablet, TAKE 1/2 TABLET BY MOUTH DAILY FOR 3 DAYS, THEN INCREASE TO 1 TABLET DAILY IN THE MORNING, Disp: 90 tablet, Rfl: 3   carvedilol (COREG) 12.5 MG tablet, Take 1 tablet (12.5 mg total) by mouth 2 (two) times daily with a meal., Disp: 180 tablet, Rfl: 3   colchicine 0.6 MG tablet, 2 pills x 1 then 0.6 mg 1 hour later max dose 1.8/24 hours do not repeat x 3 days, Disp: 30 tablet, Rfl: 5   escitalopram (LEXAPRO) 10 MG tablet, Take 1 tablet (10 mg total) by mouth daily. Overdue for labs call the office to schedule asap, Disp: 90 tablet, Rfl: 3   Loratadine (CLARITIN PO), Take by mouth., Disp: , Rfl:    Omeprazole Magnesium (PRILOSEC PO), Take by mouth. (Patient not taking: Reported on 10/02/2021), Disp: , Rfl:   EXAM:  VITALS per patient if applicable:  GENERAL: alert, oriented, appears well and in no acute distress  PSYCH/NEURO: pleasant and cooperative, no obvious depression or anxiety, speech and thought processing grossly intact  ASSESSMENT AND PLAN:  Discussed the following assessment and plan:  Acute non-recurrent pansinusitis/ETD and ear pain- Plan: POCT Influenza A/B, Novel Coronavirus, NAA ([facility]), azithromycin (ZITHROMAX) 250 MG tablet Consider ENT in the future   fever Myalgia Chills Hydration, tylenol Supportive care   Flu-like symptoms - Plan: POCT Influenza A/B, Novel Coronavirus, NAA ([facility]) Pt declines will think  about it   Acute cough - Plan: DG Chest 2 View advised ordered and if worse get CXR  Fever, unspecified fever cause - Plan: DG Chest 2 View Prn tylenol  -we discussed possible serious and likely etiologies, options for evaluation and workup, limitations of telemedicine visit vs in person visit, treatment, treatment risks and precautions. Pt is agreeable to treatment via telemedicine at this moment.   I discussed the assessment and treatment plan with the patient. The patient was provided an  opportunity to ask questions and all were answered. The patient agreed with the plan and demonstrated an understanding of the instructions.    Time spent 20 minutes Bevelyn Buckles, MD

## 2021-10-03 ENCOUNTER — Ambulatory Visit: Payer: Medicare Other | Admitting: Internal Medicine

## 2021-11-12 ENCOUNTER — Ambulatory Visit (INDEPENDENT_AMBULATORY_CARE_PROVIDER_SITE_OTHER): Payer: Medicare Other

## 2021-11-12 VITALS — Ht 62.5 in | Wt 209.0 lb

## 2021-11-12 DIAGNOSIS — Z0000 Encounter for general adult medical examination without abnormal findings: Secondary | ICD-10-CM

## 2021-11-12 NOTE — Addendum Note (Signed)
Addended by: Varney Biles on: 11/12/2021 11:16 AM   Modules accepted: Orders, Level of Service

## 2021-11-12 NOTE — Progress Notes (Signed)
Subjective:   Patricia [name] is a 71 y.o. female who presents for an Initial Medicare Annual Wellness Visit.  Review of Systems    No ROS.  Medicare Wellness Virtual Visit.  Visual/audio telehealth visit, UTA vital signs.   See social history for additional risk factors.   Cardiac Risk Factors include: advanced age (>79men, >39 women);hypertension     Objective:    Today's Vitals   11/12/21 1039  Weight: 209 lb (94.8 kg)  Height: 5' 2.5" (1.588 m)   Body mass index is 37.62 kg/m.     11/12/2021   10:45 AM  Advanced Directives  Does Patient Have a Medical Advance Directive? Yes  Type of Estate agent of [place];Living will  Does patient want to make changes to medical advance directive? No - Patient declined  Copy of Healthcare Power of Attorney in Chart? No - copy requested    Current Medications (verified) Outpatient Encounter Medications as of 11/12/2021  Medication Sig   allopurinol (ZYLOPRIM) 100 MG tablet Take 0.5 tablets (50 mg total) by mouth daily.   amLODipine (NORVASC) 5 MG tablet Take 1 tablet (5 mg total) by mouth daily. Stop candasartan hctz 32-12.5 for now   candesartan-hydrochlorothiazide (ATACAND HCT) 32-12.5 MG tablet TAKE 1/2 TABLET BY MOUTH DAILY FOR 3 DAYS, THEN INCREASE TO 1 TABLET DAILY IN THE MORNING   carvedilol (COREG) 12.5 MG tablet Take 1 tablet (12.5 mg total) by mouth 2 (two) times daily with a meal.   colchicine 0.6 MG tablet 2 pills x 1 then 0.6 mg 1 hour later max dose 1.8/24 hours do not repeat x 3 days   escitalopram (LEXAPRO) 10 MG tablet Take 1 tablet (10 mg total) by mouth daily. Overdue for labs call the office to schedule asap   Loratadine (CLARITIN PO) Take by mouth.   Omeprazole Magnesium (PRILOSEC PO) Take by mouth. (Patient not taking: Reported on 10/02/2021)   No facility-administered encounter medications on file as of 11/12/2021.    Allergies (verified) Molds & smuts   History: Past Medical History:   Diagnosis Date   Allergy    mold and mildew    Anxiety    Arthritis    cervical disc degeneration Dr. Pablo Ledger    Febrile seizures [facility])    childhood x 2 stopped age 37 or 71 y.o    Heart murmur    Hypertension    Past Surgical History:  Procedure Laterality Date   BREAST ENHANCEMENT SURGERY     Family History  Problem Relation Age of Onset   Arthritis Mother    COPD Mother        smoker    Hyperlipidemia Mother    Hypertension Mother    Cancer Father        prostate died age 39    Early death Father    Kidney disease Sister        [place]   Stroke Sister    Cancer Brother        ? type   COPD Brother        smoker    Social History   Socioeconomic History   Marital status: Divorced    Spouse name: Not on file   Number of children: Not on file   Years of education: Not on file   Highest education level: Not on file  Occupational History   Not on file  Tobacco Use   Smoking status: Never   Smokeless tobacco: Never  Substance and Sexual Activity   Alcohol use: Yes    Comment: rare    Drug use: Not Currently   Sexual activity: Not Currently  Other Topics Concern   Not on file  Social History Narrative   Divorced    2 years college    Works Civil engineer, contracting    No guns, wears seat belt, safe in relationship    No kids    Has cats    Social Determinants of Health   Financial Resource Strain: Low Risk  (11/12/2021)   Overall Financial Resource Strain (CARDIA)    Difficulty of Paying Living Expenses: Not hard at all  Food Insecurity: No Food Insecurity (11/12/2021)   Hunger Vital Sign    Worried About Running Out of Food in the Last Year: Never true    Ran Out of Food in the Last Year: Never true  Transportation Needs: No Transportation Needs (11/12/2021)   PRAPARE - Administrator, Civil Service (Medical): No    Lack of Transportation (Non-Medical): No  Physical Activity: Not on file  Stress: No Stress Concern Present  (11/12/2021)   [employer] of Occupational Health - Occupational Stress Questionnaire    Feeling of Stress : Not at all  Social Connections: Unknown (11/12/2021)   Social Connection and Isolation Panel [NHANES]    Frequency of Communication with Friends and Family: More than three times a week    Frequency of Social Gatherings with Friends and Family: More than three times a week    Attends Religious Services: Not on Marketing executive or Organizations: Not on file    Attends Banker Meetings: Not on file    Marital Status: Not on file    Tobacco Counseling Counseling given: Not Answered   Clinical Intake:  Pre-visit preparation completed: Yes        Diabetes: No  How often do you need to have someone help you when you read instructions, pamphlets, or other written materials from your doctor or pharmacy?: 1 - Never    Interpreter Needed?: No    Activities of Daily Living    11/12/2021   10:50 AM  In your present state of health, do you have any difficulty performing the following activities:  Hearing? 0  Vision? 0  Difficulty concentrating or making decisions? 0  Walking or climbing stairs? 0  Dressing or bathing? 0  Doing errands, shopping? 0  Preparing Food and eating ? N  Using the Toilet? N  In the past six months, have you accidently leaked urine? N  Do you have problems with loss of bowel control? N  Managing your Medications? N  Managing your Finances? N  Housekeeping or managing your Housekeeping? N    Patient Care Team: McLean-Scocuzza, Pasty Spillers, MD as PCP - General (Internal Medicine)  Indicate any recent Medical Services you may have received from other than [facility] providers in the past year (date may be approximate).     Assessment:   This is a routine wellness examination for Patricia [name].  Virtual Visit via Telephone Note  I connected with  Mertha Finders on 11/12/21 at 10:30 AM EDT by telephone and verified that I am  speaking with the correct person using two identifiers.  Location: Patient: home Provider: office Persons participating in the virtual visit: patient/Nurse Health Advisor   I discussed the limitations of performing an evaluation and management service by telehealth. We continued and completed visit with audio  only. Some vital signs may be absent or patient reported.   Hearing/Vision screen Hearing Screening - Comments:: Patient is able to hear conversational tones without difficulty.  No issues reported. Vision Screening - Comments:: Followed by [facility] Doctor Wears contact lenses, R eye only Cataract extraction, bilateral They have seen their ophthalmologist in the last 12 months.    Dietary issues and exercise activities discussed: Current Exercise Habits: Home exercise routine, Type of exercise: walking, Intensity: Mild   Goals Addressed             This Visit's Progress    Increase physical activity         Depression Screen    11/12/2021   10:45 AM 10/02/2021   10:08 AM 08/23/2021    3:13 PM 01/11/2021    3:30 PM 05/25/2019   11:34 AM 10/20/2017   11:31 AM  PHQ 2/9 Scores  PHQ - 2 Score 0 0 1 0 0 0    Fall Risk    11/12/2021   10:50 AM 10/02/2021   10:08 AM 08/23/2021    3:12 PM 01/11/2021    3:30 PM 05/25/2019   11:33 AM  Fall Risk   Falls in the past year? 0 0 0 0 0  Number falls in past yr: 0 0 0 0 0  Injury with Fall? 0 0 0 0 0  Risk for fall due to :  No Fall Risks No Fall Risks No Fall Risks   Follow up Falls evaluation completed;Falls prevention discussed Falls evaluation completed Falls evaluation completed Falls evaluation completed Falls evaluation completed    FALL RISK PREVENTION PERTAINING TO THE HOME: Home free of loose throw rugs in walkways, pet beds, electrical cords, etc? Yes  Adequate lighting in your home to reduce risk of falls? Yes   ASSISTIVE DEVICES UTILIZED TO PREVENT FALLS: Life alert? No  Use of a cane, walker or w/c? No   Grab bars in the bathroom? No  Shower chair or bench in shower? No  Elevated toilet seat or a handicapped toilet? No   TIMED UP AND GO: Was the test performed? No .   Cognitive Function:  Patient is alert and oriented x3.      11/12/2021   10:59 AM  6CIT Screen  What Year? 0 points  What month? 0 points  What time? 0 points  Count back from 20 0 points  Months in reverse 0 points  Repeat phrase 0 points  Total Score 0 points    Immunizations Immunization History  Administered Date(s) Administered   PFIZER(Purple Top)SARS-COV-2 Vaccination 05/13/2019    TDAP status: Due, Education has been provided regarding the importance of this vaccine. Advised may receive this vaccine at local pharmacy or Health Dept. Aware to provide a copy of the vaccination record if obtained from local pharmacy or Health Dept. Verbalized acceptance and understanding.Deferred.   Screening Tests Health Maintenance  Topic Date Due   MAMMOGRAM  Never done   DEXA SCAN  Never done   INFLUENZA VACCINE  10/15/2021   Zoster Vaccines- Shingrix (1 of 2) 11/23/2021 (Originally 03/08/1999)   COVID-19 Vaccine (2 - Pfizer series) 01/15/2022 (Originally 07/08/2019)   TETANUS/TDAP  02/14/2022 (Originally [birth year]1968)   Pneumonia Vaccine 43+ Years old (1 - PCV) 08/24/2022 (Originally 03/07/2014)   COLONOSCOPY (Pts 45-40yrs Insurance coverage will need to be confirmed)  08/24/2022 (Originally 03/07/1994)   Hepatitis C Screening  Completed   HPV VACCINES  Aged Out   Health Maintenance Health Maintenance Due  Topic Date Due   MAMMOGRAM  Never done   DEXA SCAN  Never done   INFLUENZA VACCINE  10/15/2021   Mammogram- agrees to schedule  Bone density- agrees to schedule  Lung Cancer Screening: (Low Dose CT Chest recommended if Age 10-80 years, 30 pack-year currently smoking OR have quit w/in 15years.) does not qualify.   Vision Screening: Recommended annual ophthalmology exams for early detection of glaucoma  and other disorders of the eye.  Dental Screening: Recommended annual dental exams for proper oral hygiene  Community Resource Referral / Chronic Care Management: CRR required this visit?  No   CCM required this visit?  No      Plan:     I have personally reviewed and noted the following in the patient's chart:   Medical and social history Use of alcohol, tobacco or illicit drugs  Current medications and supplements including opioid prescriptions. Patient is not currently taking opioid prescriptions. Functional ability and status Nutritional status Physical activity Advanced directives List of other physicians Hospitalizations, surgeries, and ER visits in previous 12 months Vitals Screenings to include cognitive, depression, and falls Referrals and appointments  In addition, I have reviewed and discussed with patient certain preventive protocols, quality metrics, and best practice recommendations. A written personalized care plan for preventive services as well as general preventive health recommendations were provided to patient.     Ashok Pall, LPN   06/28/2438

## 2021-11-12 NOTE — Patient Instructions (Addendum)
[name] , Thank you for taking time to come for your Medicare Wellness Visit. I appreciate your ongoing commitment to your health goals. Please review the following plan we discussed and let me know if I can assist you in the future.   These are the goals we discussed:  Goals      Increase physical activity        This is a list of the screening recommended for you and due dates:  Health Maintenance  Topic Date Due   Mammogram  Never done   DEXA scan (bone density measurement)  Never done   Flu Shot  10/15/2021   Zoster (Shingles) Vaccine (1 of 2) 11/23/2021*   COVID-19 Vaccine (2 - Pfizer series) 01/15/2022*   Tetanus Vaccine  02/14/2022*   Pneumonia Vaccine (1 - PCV) 08/24/2022*   Colon Cancer Screening  08/24/2022*   Hepatitis C Screening: USPSTF Recommendation to screen - Ages 18-79 yo.  Completed   HPV Vaccine  Aged Out  *Topic was postponed. The date shown is not the original due date.     Preventive Care 20 Years and Older, Female Preventive care refers to lifestyle choices and visits with your health care provider that can promote health and wellness. What does preventive care include? A yearly physical exam. This is also called an annual well check. Dental exams once or twice a year. Routine eye exams. Ask your health care provider how often you should have your eyes checked. Personal lifestyle choices, including: Daily care of your teeth and gums. Regular physical activity. Eating a healthy diet. Avoiding tobacco and drug use. Limiting alcohol use. Practicing safe sex. Taking low-dose aspirin every day. Taking vitamin and mineral supplements as recommended by your health care provider. What happens during an annual well check? The services and screenings done by your health care provider during your annual well check will depend on your age, overall health, lifestyle risk factors, and family history of disease. Counseling  Your health care provider may ask  you questions about your: Alcohol use. Tobacco use. Drug use. Emotional well-being. Home and relationship well-being. Sexual activity. Eating habits. History of falls. Memory and ability to understand (cognition). Work and work Astronomer. Reproductive health. Screening  You may have the following tests or measurements: Height, weight, and BMI. Blood pressure. Lipid and cholesterol levels. These may be checked every 5 years, or more frequently if you are over 35 years old. Skin check. Lung cancer screening. You may have this screening every year starting at age 46 if you have a 30-pack-year history of smoking and currently smoke or have quit within the past 15 years. Fecal occult blood test (FOBT) of the stool. You may have this test every year starting at age 42. Flexible sigmoidoscopy or colonoscopy. You may have a sigmoidoscopy every 5 years or a colonoscopy every 10 years starting at age 74. Hepatitis C blood test. Hepatitis B blood test. Sexually transmitted disease (STD) testing. Diabetes screening. This is done by checking your blood sugar (glucose) after you have not eaten for a while (fasting). You may have this done every 1-3 years. Bone density scan. This is done to screen for osteoporosis. You may have this done starting at age 71. Mammogram. This may be done every 1-2 years. Talk to your health care provider about how often you should have regular mammograms. Talk with your health care provider about your test results, treatment options, and if necessary, the need for more tests. Vaccines  Your health  care provider may recommend certain vaccines, such as: Influenza vaccine. This is recommended every year. Tetanus, diphtheria, and acellular pertussis (Tdap, Td) vaccine. You may need a Td booster every 10 years. Zoster vaccine. You may need this after age 21. Pneumococcal 13-valent conjugate (PCV13) vaccine. One dose is recommended after age 15. Pneumococcal  polysaccharide (PPSV23) vaccine. One dose is recommended after age 66. Talk to your health care provider about which screenings and vaccines you need and how often you need them. This information is not intended to replace advice given to you by your health care provider. Make sure you discuss any questions you have with your health care provider. Document Released: 03/30/2015 Document Revised: 11/21/2015 Document Reviewed: 01/02/2015 Elsevier Interactive Patient Education  2017 [place] Prevention in the Home Falls can cause injuries. They can happen to people of all ages. There are many things you can do to make your home safe and to help prevent falls. What can I do on the outside of my home? Regularly fix the edges of walkways and driveways and fix any cracks. Remove anything that might make you trip as you walk through a door, such as a raised step or threshold. Trim any bushes or trees on the path to your home. Use bright outdoor lighting. Clear any walking paths of anything that might make someone trip, such as rocks or tools. Regularly check to see if handrails are loose or broken. Make sure that both sides of any steps have handrails. Any raised decks and porches should have guardrails on the edges. Have any leaves, snow, or ice cleared regularly. Use sand or salt on walking paths during winter. Clean up any spills in your garage right away. This includes oil or grease spills. What can I do in the bathroom? Use night lights. Install grab bars by the toilet and in the tub and shower. Do not use towel bars as grab bars. Use non-skid mats or decals in the tub or shower. If you need to sit down in the shower, use a plastic, non-slip stool. Keep the floor dry. Clean up any water that spills on the floor as soon as it happens. Remove soap buildup in the tub or shower regularly. Attach bath mats securely with double-sided non-slip rug tape. Do not have throw rugs and other  things on the floor that can make you trip. What can I do in the bedroom? Use night lights. Make sure that you have a light by your bed that is easy to reach. Do not use any sheets or blankets that are too big for your bed. They should not hang down onto the floor. Have a firm chair that has side arms. You can use this for support while you get dressed. Do not have throw rugs and other things on the floor that can make you trip. What can I do in the kitchen? Clean up any spills right away. Avoid walking on wet floors. Keep items that you use a lot in easy-to-reach places. If you need to reach something above you, use a strong step stool that has a grab bar. Keep electrical cords out of the way. Do not use floor polish or wax that makes floors slippery. If you must use wax, use non-skid floor wax. Do not have throw rugs and other things on the floor that can make you trip. What can I do with my stairs? Do not leave any items on the stairs. Make sure that there are handrails on  both sides of the stairs and use them. Fix handrails that are broken or loose. Make sure that handrails are as long as the stairways. Check any carpeting to make sure that it is firmly attached to the stairs. Fix any carpet that is loose or worn. Avoid having throw rugs at the top or bottom of the stairs. If you do have throw rugs, attach them to the floor with carpet tape. Make sure that you have a light switch at the top of the stairs and the bottom of the stairs. If you do not have them, ask someone to add them for you. What else can I do to help prevent falls? Wear shoes that: Do not have high heels. Have rubber bottoms. Are comfortable and fit you well. Are closed at the toe. Do not wear sandals. If you use a stepladder: Make sure that it is fully opened. Do not climb a closed stepladder. Make sure that both sides of the stepladder are locked into place. Ask someone to hold it for you, if possible. Clearly  mark and make sure that you can see: Any grab bars or handrails. First and last steps. Where the edge of each step is. Use tools that help you move around (mobility aids) if they are needed. These include: Canes. Walkers. Scooters. Crutches. Turn on the lights when you go into a dark area. Replace any light bulbs as soon as they burn out. Set up your furniture so you have a clear path. Avoid moving your furniture around. If any of your floors are uneven, fix them. If there are any pets around you, be aware of where they are. Review your medicines with your doctor. Some medicines can make you feel dizzy. This can increase your chance of falling. Ask your doctor what other things that you can do to help prevent falls. This information is not intended to replace advice given to you by your health care provider. Make sure you discuss any questions you have with your health care provider. Document Released: 12/28/2008 Document Revised: 08/09/2015 Document Reviewed: 04/07/2014 Elsevier Interactive Patient Education  2017 [employer].

## 2021-11-27 ENCOUNTER — Ambulatory Visit: Payer: Medicare Other | Admitting: Internal Medicine

## 2021-11-29 DIAGNOSIS — N281 Cyst of kidney, acquired: Secondary | ICD-10-CM | POA: Diagnosis not present

## 2021-11-29 DIAGNOSIS — N1832 Chronic kidney disease, stage 3b: Secondary | ICD-10-CM | POA: Diagnosis not present

## 2021-11-29 DIAGNOSIS — I129 Hypertensive chronic kidney disease with stage 1 through stage 4 chronic kidney disease, or unspecified chronic kidney disease: Secondary | ICD-10-CM | POA: Diagnosis not present

## 2021-11-29 DIAGNOSIS — E669 Obesity, unspecified: Secondary | ICD-10-CM | POA: Diagnosis not present

## 2021-11-29 DIAGNOSIS — E785 Hyperlipidemia, unspecified: Secondary | ICD-10-CM | POA: Diagnosis not present

## 2021-11-29 DIAGNOSIS — M549 Dorsalgia, unspecified: Secondary | ICD-10-CM | POA: Diagnosis not present

## 2021-11-29 DIAGNOSIS — N390 Urinary tract infection, site not specified: Secondary | ICD-10-CM | POA: Diagnosis not present

## 2021-11-29 DIAGNOSIS — M109 Gout, unspecified: Secondary | ICD-10-CM | POA: Diagnosis not present

## 2021-12-05 ENCOUNTER — Other Ambulatory Visit: Payer: Self-pay | Admitting: Internal Medicine

## 2021-12-05 ENCOUNTER — Telehealth: Payer: Self-pay | Admitting: Internal Medicine

## 2021-12-05 NOTE — Telephone Encounter (Signed)
Pt called stating [place] [facility] are supposed to send the provider information in regards to her to kidney and labs

## 2021-12-06 ENCOUNTER — Ambulatory Visit: Payer: Medicare Other | Admitting: Internal Medicine

## 2021-12-10 ENCOUNTER — Encounter: Payer: Self-pay | Admitting: Internal Medicine

## 2021-12-20 ENCOUNTER — Ambulatory Visit (INDEPENDENT_AMBULATORY_CARE_PROVIDER_SITE_OTHER): Payer: Medicare Other | Admitting: Internal Medicine

## 2021-12-20 ENCOUNTER — Encounter: Payer: Self-pay | Admitting: Internal Medicine

## 2021-12-20 VITALS — BP 132/70 | HR 60 | Temp 98.1°F | Ht 62.5 in | Wt 203.0 lb

## 2021-12-20 DIAGNOSIS — R351 Nocturia: Secondary | ICD-10-CM | POA: Diagnosis not present

## 2021-12-20 DIAGNOSIS — F419 Anxiety disorder, unspecified: Secondary | ICD-10-CM

## 2021-12-20 DIAGNOSIS — N1832 Chronic kidney disease, stage 3b: Secondary | ICD-10-CM

## 2021-12-20 DIAGNOSIS — N1831 Chronic kidney disease, stage 3a: Secondary | ICD-10-CM | POA: Diagnosis not present

## 2021-12-20 DIAGNOSIS — I10 Essential (primary) hypertension: Secondary | ICD-10-CM

## 2021-12-20 DIAGNOSIS — M109 Gout, unspecified: Secondary | ICD-10-CM | POA: Diagnosis not present

## 2021-12-20 DIAGNOSIS — B078 Other viral warts: Secondary | ICD-10-CM | POA: Diagnosis not present

## 2021-12-20 DIAGNOSIS — M47816 Spondylosis without myelopathy or radiculopathy, lumbar region: Secondary | ICD-10-CM

## 2021-12-20 DIAGNOSIS — M47812 Spondylosis without myelopathy or radiculopathy, cervical region: Secondary | ICD-10-CM | POA: Diagnosis not present

## 2021-12-20 DIAGNOSIS — Z1283 Encounter for screening for malignant neoplasm of skin: Secondary | ICD-10-CM | POA: Diagnosis not present

## 2021-12-20 DIAGNOSIS — N3281 Overactive bladder: Secondary | ICD-10-CM

## 2021-12-20 MED ORDER — ALLOPURINOL 100 MG PO TABS: 50.0000 mg | ORAL_TABLET | Freq: Every day | ORAL | 3 refills | Status: DC

## 2021-12-20 MED ORDER — AMLODIPINE BESYLATE 5 MG PO TABS: 5.0000 mg | ORAL_TABLET | Freq: Every day | ORAL | 3 refills | Status: DC

## 2021-12-20 MED ORDER — CARVEDILOL 12.5 MG PO TABS: 12.5000 mg | ORAL_TABLET | Freq: Two times a day (BID) | ORAL | 3 refills | Status: DC

## 2021-12-20 MED ORDER — ESCITALOPRAM OXALATE 10 MG PO TABS: 10.0000 mg | ORAL_TABLET | Freq: Every day | ORAL | 3 refills | Status: DC

## 2021-12-20 MED ORDER — COLCHICINE 0.6 MG PO TABS: ORAL_TABLET | ORAL | 5 refills | Status: DC

## 2021-12-20 NOTE — Patient Instructions (Addendum)
Think about high dose flu shot, RSV vaccine, Tdap, prevar 20 for pneumonia prevention   Pataday eye drops for itching and watery eyes  Olopatadine Eye Solution What is this medication? OLOPATADINE (oh loe pa TA deen) treats allergy symptoms, such as red, itchy eyes. It works by reducing the release of substances in your body that cause allergy symptoms, which decreases inflammation in the eye. It belongs to a group of medications called antihistamines. This medicine may be used for other purposes; ask your health care provider or pharmacist if you have questions. COMMON BRAND NAME(S): Pataday, Patanol, Pazeo What should I tell my care team before I take this medication? They need to know if you have any of these conditions: Wear contact lenses An unusual or allergic reaction to olopatadine, other medications, foods, dyes, or preservatives Pregnant or trying to get pregnant Breast-feeding How should I use this medication? This medication is only for use in the eye. Follow the directions on the prescription label. Wash hands before and after use. Tilt your head back slightly and pull your lower eyelid down with your index finger to form a pouch. Try not to touch the tip of the dropper or tube to your eye, fingertips, or other surfaces. Squeeze the prescribed number of drops into the pouch. Close the eye gently to spread the drops. Your vision may blur for a few minutes. Finish the full course of medication prescribed by your care team even if you think your condition is better. Do not stop using except on the advice of your care team. Talk to your care team about the use of this medication in children. While this medication may be prescribed for children as young as 15 years of age for selected conditions, precautions do apply. Overdosage: If you think you have taken too much of this medicine contact a poison control center or emergency room at once. NOTE: This medicine is only for you. Do not share  this medicine with others. What if I miss a dose? If you miss a dose, use it as soon as you can. If it is almost time for your next dose, use only that dose. Do not use double or extra doses. What may interact with this medication? Interactions are not expected. Do not use any other eye products without talking to your care team. This list may not describe all possible interactions. Give your health care provider a list of all the medicines, herbs, non-prescription drugs, or dietary supplements you use. Also tell them if you smoke, drink alcohol, or use illegal drugs. Some items may interact with your medicine. What should I watch for while using this medication? Visit your care team for regular checks on your progress. Tell your care team if your symptoms do not start to get better or if they get worse. Stop using this medication if your eyes get swollen, painful, or have a discharge, and see your care team as soon as you can. Do not wear contact lenses if your eyes are red. This medication should not be used to treat irritation that is caused by contact lenses. Remove soft contact lenses before using this medication. Contact lenses may be inserted 10 minutes after putting the medication in the eye. What side effects may I notice from receiving this medication? Side effects that you should report to your care team as soon as possible: Allergic reactions--skin rash, itching, hives, swelling of the face, lips, tongue, or throat New or worsening eye pain, redness, irritation, or discharge  Side effects that usually do not require medical attention (report to your care team if they continue or are bothersome): Blurry vision Eye irritation or itching This list may not describe all possible side effects. Call your doctor for medical advice about side effects. You may report side effects to FDA at 1-800-FDA-1088. Where should I keep my medication? Keep out of reach of children and pets. Store between 4  and 25 degrees C (39 and 77 degrees F). Keep container tightly closed. Protect from light. Get rid of any unused medication after the expiration date. NOTE: This sheet is a summary. It may not cover all possible information. If you have questions about this medicine, talk to your doctor, pharmacist, or health care provider.  2023 Elsevier/Gold Standard (2021-01-24 00:00:00)  Call and sch mammogram and bone density  Think about colonoscopy  [facility]-Dr. Sherlene Shams    Phone Fax E-mail Address  [phone] Not available [place] [id number]     Specialties     Obstetrics and Gynecology             Overactive Bladder, Adult  Overactive bladder is a condition in which a person has a sudden and frequent need to urinate. A person might also leak urine if he or she cannot get to the bathroom fast enough (urinary incontinence). Sometimes, symptoms can interfere with work or social activities. What are the causes? Overactive bladder is associated with poor nerve signals between your bladder and your brain. Your bladder may get the signal to empty before it is full. You may also have very sensitive muscles that make your bladder squeeze too soon. This condition may also be caused by other factors, such as: Medical conditions: Urinary tract infection. Infection of nearby tissues. Prostate enlargement. Bladder stones, inflammation, or tumors. Diabetes. Muscle or nerve weakness, especially from these conditions: A spinal cord injury. Stroke. Multiple sclerosis. Parkinson's disease. Other causes: Surgery on the uterus or urethra. Drinking too much caffeine or alcohol. Certain medicines, especially those that eliminate extra fluid in the body (diuretics). Constipation. What increases the risk? You may be at greater risk for overactive bladder if you: Are an older adult. Smoke. Are going through menopause. Have prostate problems. Have a neurological  disease, such as stroke, dementia, Parkinson's disease, or multiple sclerosis (MS). Eat or drink alcohol, spicy food, caffeine, and other things that irritate the bladder. Are overweight or obese. What are the signs or symptoms? Symptoms of this condition include a sudden, strong urge to urinate. Other symptoms include: Leaking urine. Urinating 8 or more times a day. Waking up to urinate 2 or more times overnight. How is this diagnosed? This condition may be diagnosed based on: Your symptoms and medical history. A physical exam. Blood or urine tests to check for possible causes, such as infection. You may also need to see a health care provider who specializes in urinary tract problems. This is called a urologist. How is this treated? Treatment for overactive bladder depends on the cause of your condition and whether it is mild or severe. Treatment may include: Bladder training, such as: Learning to control the urge to urinate by following a schedule to urinate at regular intervals. Doing Kegel exercises to strengthen the pelvic floor muscles that support your bladder. Special devices, such as: Biofeedback. This uses sensors to help you become aware of your body's signals. Electrical stimulation. This uses electrodes placed inside the body (implanted) or outside the body. These electrodes send gentle  pulses of electricity to strengthen the nerves or muscles that control the bladder. Women may use a plastic device, called a pessary, that fits into the vagina and supports the bladder. Medicines, such as: Antibiotics to treat bladder infection. Antispasmodics to stop the bladder from releasing urine at the wrong time. Tricyclic antidepressants to relax bladder muscles. Injections of botulinum toxin type A directly into the bladder tissue to relax bladder muscles. Surgery, such as: A device may be implanted to help manage the nerve signals that control urination. An electrode may be  implanted to stimulate electrical signals in the bladder. A procedure may be done to change the shape of the bladder. This is done only in very severe cases. Follow these instructions at home: Eating and drinking  Make diet or lifestyle changes recommended by your health care provider. These may include: Drinking fluids throughout the day and not only with meals. Cutting down on caffeine or alcohol. Eating a healthy and balanced diet to prevent constipation. This may include: Choosing foods that are high in fiber, such as beans, whole grains, and fresh fruits and vegetables. Limiting foods that are high in fat and processed sugars, such as fried and sweet foods. Lifestyle  Lose weight if needed. Do not use any products that contain nicotine or tobacco. These include cigarettes, chewing tobacco, and vaping devices, such as e-cigarettes. If you need help quitting, ask your health care provider. General instructions Take over-the-counter and prescription medicines only as told by your health care provider. If you were prescribed an antibiotic medicine, take it as told by your health care provider. Do not stop taking the antibiotic even if you start to feel better. Use any implants or pessary as told by your health care provider. If needed, wear pads to absorb urine leakage. Keep a log to track how much and when you drink, and when you need to urinate. This will help your health care provider monitor your condition. Keep all follow-up visits. This is important. Contact a health care provider if: You have a fever or chills. Your symptoms do not get better with treatment. Your pain and discomfort get worse. You have more frequent urges to urinate. Get help right away if: You are not able to control your bladder. Summary Overactive bladder refers to a condition in which a person has a sudden and frequent need to urinate. Several conditions may lead to an overactive bladder. Treatment for  overactive bladder depends on the cause and severity of your condition. Making lifestyle changes, doing Kegel exercises, keeping a log, and taking medicines can help with this condition. This information is not intended to replace advice given to you by your health care provider. Make sure you discuss any questions you have with your health care provider. Document Revised: 11/21/2019 Document Reviewed: 11/21/2019 Elsevier Patient Education  [place] for Chronic Kidney Disease Chronic kidney disease (CKD) is when your kidneys are not working well. They cannot remove waste, fluids, and other substances from your blood the way they should. These substances can build up, which can worsen kidney damage and affect how your body works. Eating certain foods can lead to a buildup of these substances. Changing your diet can help prevent more kidney damage. Diet changes may also delay dialysis or even keep you from needing it. What nutrients should I limit? Work with your treatment team and a food expert (dietitian) to make a meal plan that's right for you. Foods you can eat and  foods you should limit or avoid will depend on the stage of your kidney disease and any other health conditions you have. The items listed below are not a complete list. Talk with your dietitian to learn what is best for you. Potassium Potassium affects how steadily your heart beats. Too much potassium in your blood can cause an irregular heartbeat or even a heart attack. You may need to limit foods that are high in potassium, such as: Liquid milk and soy milk. Salt substitutes that contain potassium. Fruits like bananas, apricots, nectarines, melon, prunes, raisins, kiwi, and oranges. Vegetables, such as potatoes, sweet potatoes, yams, tomatoes, leafy greens, beets, avocado, pumpkin, and winter squash. Beans, like lima beans. Nuts. Phosphorus Phosphorus is a mineral found in your bones. You need a balance  between calcium and phosphorus to build and maintain healthy bones. Too much added phosphorus from the foods you eat can pull calcium from your bones. Losing calcium can make your bones weak and more likely to break. Too much phosphorus can also make your skin itch. You may need to limit foods that are high in phosphorus or that have added phosphorus, such as: Liquid milk and dairy products. Dark-colored sodas or soft drinks. Bran cereals and oatmeal. Protein  Protein helps you make and keep muscle. Protein also helps to repair your body's cells and tissues. One of the natural breakdown products of protein is a waste product called urea. When your kidneys are not working well, they cannot remove types of waste like urea. Reducing protein in your diet can help keep urea from building up in your blood. Depending on your stage of kidney disease, you may need to eat smaller portions of foods that are high in protein. Sources of animal protein include: Meat (all types). Fish and seafood. Poultry. Eggs. Dairy. Other protein foods include: Beans and legumes. Nuts and nut butter. Soy, like tofu.  Sodium Salt (sodium) helps to keep a healthy balance of fluids in your body. Too much salt can increase your blood pressure, which can harm your heart and lungs. Extra salt can also cause your body to keep too much fluid, making your kidneys work harder. You may need to limit or avoid foods that are high in salt, such as: Salt seasonings. Soy and teriyaki sauce. Packaged, precooked, cured, or processed meats, such as sausages or meat loaves. Sardines. Salted crackers and snack foods. Fast food. Canned soups and most canned foods. Pickled foods. Vegetable juice. Boxed mixes or ready-to-eat boxed meals and side dishes. Bottled dressings, sauces, and marinades. Talk with your dietitian about how much potassium, phosphorus, protein, and salt you may have each day. Helpful tips Read food labels  Check  the amount of salt in foods. Limit foods that have salt or sodium listed among the first five ingredients. Try to eat low-salt foods. Check the ingredient list for added phosphorus or potassium. "Phos" in an ingredient is a sign that phosphorus has been added. Do not buy foods that are calcium-enriched or that have calcium added to them (are fortified). Buy canned vegetables and beans that say "no salt added" and rinse them before eating. Lifestyle Limit the amount of protein you eat from animal sources each day. Focus on protein from plant sources, like tofu and dried beans, peas, and lentils. Do not add salt to food when cooking or before eating. Do not eat star fruit. It can be toxic for people with kidney problems. Talk with your health care provider before taking any vitamin  or mineral supplements. If told by your health care provider, track how much liquid you drink so you can avoid drinking too much. You may need to include foods you eat that are made mostly from water, like gelatin, ice cream, soups, and juicy fruits and vegetables. If you have diabetes: If you have diabetes (diabetes mellitus) and CKD, you need to keep your blood sugar (glucose) in the target range recommended by your health care provider. Follow your diabetes management plan. This may include: Checking your blood glucose regularly. Taking medicines by mouth, or taking insulin, or both. Exercising for at least 30 minutes on 5 or more days each week, or as told by your health care provider. Tracking how many servings of carbohydrates you eat at each meal. Not using orange juice to treat low blood sugars. Instead, use apple juice, cranberry juice, or clear soda. You may be given guidelines on what foods and nutrients you may eat, and how much you can have each day. This depends on your stage of kidney disease and whether you have high blood pressure (hypertension). Follow the meal plan your dietitian gives you. To learn  more: [employer] of Diabetes and Digestive and Kidney Diseases: [employer]en[employer]: kidney.org Summary Chronic kidney disease (CKD) is when your kidneys are not working well. They cannot remove waste, fluids, and other substances from your blood the way they should. These substances can build up, which can worsen kidney damage and affect how your body works. Changing your diet can help prevent more kidney damage. Diet changes may also delay dialysis or even keep you from needing it. Diet changes are different for each person with CKD. Work with a dietitian to set up a meal plan that is right for you. This information is not intended to replace advice given to you by your health care provider. Make sure you discuss any questions you have with your health care provider. Document Revised: 06/21/2021 Document Reviewed: 06/27/2019 Elsevier Patient Education  [place].  Chronic Kidney Disease, Adult Chronic kidney disease (CKD) occurs when the kidneys are slowly and permanently damaged over a long period of time. The kidneys are a pair of organs that do many important jobs in the body, including: Removing waste and extra fluid from the blood to make urine. Making hormones that maintain the amount of fluid in tissues and blood vessels. Maintaining the right amount of fluids and chemicals in the body. A small amount of kidney damage may not cause problems, but a large amount of damage may make it hard or impossible for the kidneys to work right. Steps must be taken to slow kidney damage or to stop it from getting worse. If steps are not taken, the kidneys may stop working permanently (end-stage renal disease, or ESRD). Most of the time, CKD does not go away, but it can often be controlled. People who have CKD are usually able to live full lives. What are the causes? The most common causes of this condition are diabetes and high blood pressure (hypertension). Other  causes include: Cardiovascular diseases. These affect the heart and blood vessels. Kidney diseases. These include: Glomerulonephritis, or inflammation of the tiny filters in the kidneys. Interstitial nephritis. This is swelling of the small tubes of the kidneys and of the surrounding structures. Polycystic kidney disease, in which clusters of fluid-filled sacs form within the kidneys. Renal vascular disease. This includes disorders that affect the arteries and veins of the kidneys. Diseases that affect the body's defense  system (immune system). A problem with urine flow. This may be caused by: Kidney stones. Cancer. An enlarged prostate, in males. A kidney infection or urinary tract infection (UTI) that keeps coming back. Vasculitis. This is swelling or inflammation of the blood vessels. What increases the risk? Your chances of having kidney disease increase with age. The following factors may make you more likely to develop this condition: A family history of kidney disease or kidney failure. Kidney failure means the kidneys can no longer work right. Certain genetic diseases. Taking medicines often that are damaging to the kidneys. Being around or being in contact with toxic substances. Obesity. A history of tobacco use. What are the signs or symptoms? Symptoms of this condition include: Feeling very tired (lethargic) and having less energy. Swelling, or edema, of the face, legs, ankles, or feet. Nausea or vomiting, or loss of appetite. Confusion or trouble concentrating. Muscle twitches and cramps, especially in the legs. Dry, itchy skin. A metallic taste in the mouth. Producing less urine, or producing more urine (especially at night). Shortness of breath. Trouble sleeping. CKD may also result in not having enough red blood cells or hemoglobin in the blood (anemia) or having weak bones (bone disease). Symptoms develop slowly and may not be obvious until the kidney damage  becomes severe. It is possible to have kidney disease for years without having symptoms. How is this diagnosed? This condition may be diagnosed based on: Blood tests. Urine tests. Imaging tests, such as an ultrasound or a CT scan. A kidney biopsy. This involves removing a sample of kidney tissue to be looked at under a microscope. Results from these tests will help to determine how serious the CKD is. How is this treated? There is no cure for most cases of this condition, but treatment usually relieves symptoms and prevents or slows the worsening of the disease. Treatment may include: Diet changes, which may require you to avoid alcohol and foods that are high in salt, potassium, phosphorous, and protein. Medicines. These may: Lower blood pressure. Control blood sugar (glucose). Relieve anemia. Relieve swelling. Protect your bones. Improve the balance of salts and minerals in your blood (electrolytes). Dialysis, which is a type of treatment that removes toxic waste from the body. It may be needed if you have kidney failure. Managing any other conditions that are causing your CKD or making it worse. Follow these instructions at home: Medicines Take over-the-counter and prescription medicines only as told by your health care provider. The amount of some medicines that you take may need to be changed. Do not take any new medicines unless approved by your health care provider. Many medicines can make kidney damage worse. Do not take any vitamin and mineral supplements unless approved by your health care provider. Many nutritional supplements can make kidney damage worse. Lifestyle  Do not use any products that contain nicotine or tobacco, such as cigarettes, e-cigarettes, and chewing tobacco. If you need help quitting, ask your health care provider. If you drink alcohol: Limit how much you use to: 0-1 drink a day for women who are not pregnant. 0-2 drinks a day for men. Know how much  alcohol is in your drink. In the [place] one drink equals one 12 oz bottle of beer (355 mL), one 5 oz glass of wine (148 mL), or one 1 oz glass of hard liquor (44 mL). Maintain a healthy weight. If you need help, ask your health care provider. General instructions  Follow instructions from your health care  provider about eating or drinking restrictions, including any prescribed diet. Track your blood pressure at home. Report changes in your blood pressure as told. If you are being treated for diabetes, track your blood glucose levels as told. Start or continue an exercise plan. Exercise at least 30 minutes a day, 5 days a week. Keep your immunizations up to date as told. Keep all follow-up visits. This is important. Where to find more information American Association of Kidney Patients: BombTimer.gl National Kidney Foundation: www.kidney.[place]: https://mathis.com/ Life Options: www.lifeoptions.org Kidney School: www.kidneyschool.org Contact a health care provider if: Your symptoms get worse. You develop new symptoms. Get help right away if: You develop symptoms of ESRD. These include: Headaches. Numbness in your hands or feet. Easy bruising. Frequent hiccups. Chest pain. Shortness of breath. Lack of menstrual periods, in women. You have a fever. You are producing less urine than usual. You have pain or bleeding when you urinate or when you have a bowel movement. These symptoms may represent a serious problem that is an emergency. Do not wait to see if the symptoms will go away. Get medical help right away. Call your local emergency services (911 in the [place] Do not drive yourself to the hospital. Summary Chronic kidney disease (CKD) occurs when the kidneys become damaged slowly over a long period of time. The most common causes of this condition are diabetes and high blood pressure (hypertension). There is no cure for most cases of CKD, but treatment usually relieves  symptoms and prevents or slows the worsening of the disease. Treatment may include a combination of lifestyle changes, medicines, and dialysis. This information is not intended to replace advice given to you by your health care provider. Make sure you discuss any questions you have with your health care provider. Document Revised: 06/08/2019 Document Reviewed: 06/08/2019 Elsevier Patient Education  [place] about Colonoscopy, Adult A colonoscopy is a procedure to look at the entire large intestine. This procedure is done using a long, thin, flexible tube that has a camera on the end. You may have a colonoscopy: As a part of normal colorectal screening. If you have certain symptoms, such as: A low number of red blood cells in your blood (anemia). Diarrhea that does not go away. Pain in your abdomen. Blood in your stool. A colonoscopy can help screen for and diagnose medical problems, including: An abnormal growth of cells or tissue (tumor). Abnormal growths within the lining of your intestine (polyps). Inflammation. Areas of bleeding. Tell your health care provider about: Any allergies you have. All medicines you are taking, including vitamins, herbs, eye drops, creams, and over-the-counter medicines. Any problems you or family members have had with anesthetic medicines. Any bleeding problems you have. Any surgeries you have had. Any medical conditions you have. Any problems you have had with having bowel movements. Whether you are pregnant or may be pregnant. What are the risks? Generally, this is a safe procedure. However, problems may occur, including: Bleeding. Damage to your intestine. Allergic reactions to medicines given during the procedure. Infection. This is rare. What happens before the procedure? Eating and drinking restrictions Follow instructions from your health care provider about eating or drinking restrictions, which may include: A few days  before the procedure: Follow a low-fiber diet. Avoid nuts, seeds, dried fruit, raw fruits, and vegetables. 1-3 days before the procedure: Eat only gelatin dessert or ice pops. Drink only clear liquids, such as water, clear juice, clear broth or bouillon, black  coffee or tea, or clear soft drinks or sports drinks. Avoid liquids that contain red or purple dye. The day of the procedure: Do not eat solid foods. You may continue to drink clear liquids until up to 2 hours before the procedure. Do not eat or drink anything starting 2 hours before the procedure, or within the time period that your health care provider recommends. Bowel prep If you were prescribed a bowel prep to take by mouth (orally) to clean out your colon: Take it as told by your health care provider. Starting the day before your procedure, you will need to drink a large amount of liquid medicine. The liquid will cause you to have many bowel movements of loose stool until your stool becomes almost clear or light green. If your skin or the opening between the buttocks (anus) gets irritated from diarrhea, you may relieve the irritation using: Wipes with medicine in them, such as adult wet wipes with aloe and vitamin E. A product to soothe skin, such as petroleum jelly. If you vomit while drinking the bowel prep: Take a break for up to 60 minutes. Begin the bowel prep again. Call your health care provider if you keep vomiting or you cannot take the bowel prep without vomiting. To clean out your colon, you may also be given: Laxative medicines. These help you have a bowel movement. Instructions for enema use. An enema is liquid medicine injected into your rectum. Medicines Ask your health care provider about: Changing or stopping your regular medicines or supplements. This is especially important if you are taking iron supplements, diabetes medicines, or blood thinners. Taking medicines such as aspirin and ibuprofen. These medicines  can thin your blood. Do not take these medicines unless your health care provider tells you to take them. Taking over-the-counter medicines, vitamins, herbs, and supplements. General instructions Ask your health care provider what steps will be taken to help prevent infection. These may include washing skin with a germ-killing soap. If you will be going home right after the procedure, plan to have a responsible adult: Take you home from the hospital or clinic. You will not be allowed to drive. Care for you for the time you are told. What happens during the procedure?  An IV will be inserted into one of your veins. You will be given a medicine to make you fall asleep (general anesthetic). You will lie on your side with your knees bent. A lubricant will be put on the tube. Then the tube will be: Inserted into your anus. Gently eased through all parts of your large intestine. Air will be sent into your colon to keep it open. This may cause some pressure or cramping. Images will be taken with the camera and will appear on a screen. A small tissue sample may be removed to be looked at under a microscope (biopsy). The tissue may be sent to a lab for testing if any signs of problems are found. If small polyps are found, they may be removed and checked for cancer cells. When the procedure is finished, the tube will be removed. The procedure may vary among health care providers and hospitals. What happens after the procedure? Your blood pressure, heart rate, breathing rate, and blood oxygen level will be monitored until you leave the hospital or clinic. You may have a small amount of blood in your stool. You may pass gas and have mild cramping or bloating in your abdomen. This is caused by the air that was used  to open your colon during the exam. If you were given a sedative during the procedure, it can affect you for several hours. Do not drive or operate machinery until your health care provider  says that it is safe. It is up to you to get the results of your procedure. Ask your health care provider, or the department that is doing the procedure, when your results will be ready. Summary A colonoscopy is a procedure to look at the entire large intestine. Follow instructions from your health care provider about eating and drinking before the procedure. If you were prescribed an oral bowel prep to clean out your colon, take it as told by your health care provider. During the colonoscopy, a flexible tube with a camera on its end is inserted into the anus and then passed into all parts of the large intestine. This information is not intended to replace advice given to you by your health care provider. Make sure you discuss any questions you have with your health care provider. Document Revised: 02/25/2021 Document Reviewed: 10/24/2020 Elsevier Patient Education  [place].

## 2021-12-20 NOTE — Progress Notes (Signed)
Chief Complaint  Patient presents with   Follow-up    3 month f/u   F/u  1. Ckd 3 a established with [facility] in [place] stable and f/u 02/2022 no bxs needed  With htn controlled norvasc 5 mg qd and coreg 12.5 mg bid 2. Wart right hand middle finger referred to derm  3. C/o overactive bladder/leakage at night with urinating through a supper pad  4. Chronic neck and low back pain Xrays + arthritis     Review of Systems  Constitutional:  Negative for weight loss.  HENT:  Negative for hearing loss.   Eyes:  Negative for blurred vision.  Respiratory:  Negative for shortness of breath.   Cardiovascular:  Negative for chest pain.  Gastrointestinal:  Negative for abdominal pain and blood in stool.  Genitourinary:  Negative for dysuria.       +overactive bladder nocuturia  Musculoskeletal:  Negative for falls and joint pain.  Skin:  Negative for rash.  Neurological:  Negative for headaches.  Psychiatric/Behavioral:  Negative for depression.    Past Medical History:  Diagnosis Date   Allergy    mold and mildew    Anxiety    Arthritis    cervical disc degeneration Dr. Lanelle Bal    Febrile seizures [facility])    childhood x 2 stopped age 56 or 71 y.o    Heart murmur    Hypertension    Wears contact lenses    right eye contact   Past Surgical History:  Procedure Laterality Date   BREAST ENHANCEMENT SURGERY     Family History  Problem Relation Age of Onset   Arthritis Mother    COPD Mother        smoker    Hyperlipidemia Mother    Hypertension Mother    Cancer Father        prostate died age 9    Early death Father    Kidney disease Sister        Albrights   Stroke Sister    Cancer Brother        ? type   COPD Brother        smoker    Social History   Socioeconomic History   Marital status: Divorced    Spouse name: Not on file   Number of children: Not on file   Years of education: Not on file   Highest education level: Not on file  Occupational  History   Not on file  Tobacco Use   Smoking status: Never   Smokeless tobacco: Never  Substance and Sexual Activity   Alcohol use: Yes    Comment: rare    Drug use: Not Currently   Sexual activity: Not Currently  Other Topics Concern   Not on file  Social History Narrative   Divorced    2 years college    Works Biochemist, clinical    No guns, wears seat belt, safe in relationship    No kids    Has cats    Social Determinants of Health   Financial Resource Strain: Low Risk  (11/12/2021)   Overall Financial Resource Strain (CARDIA)    Difficulty of Paying Living Expenses: Not hard at all  Food Insecurity: No Food Insecurity (11/12/2021)   Hunger Vital Sign    Worried About Running Out of Food in the Last Year: Never true    [place] in the Last Year: Never true  Transportation Needs: No Transportation Needs (  11/12/2021)   PRAPARE - Hydrologist (Medical): No    Lack of Transportation (Non-Medical): No  Physical Activity: Not on file  Stress: No Stress Concern Present (11/12/2021)   [place]    Feeling of Stress : Not at all  Social Connections: Unknown (11/12/2021)   Social Connection and Isolation Panel [NHANES]    Frequency of Communication with Friends and Family: More than three times a week    Frequency of Social Gatherings with Friends and Family: More than three times a week    Attends Religious Services: Not on file    Active Member of Clubs or Organizations: Not on file    Attends Archivist Meetings: Not on file    Marital Status: Not on file  Intimate Partner Violence: Not At Risk (11/12/2021)   Humiliation, Afraid, Rape, and Kick questionnaire    Fear of Current or Ex-Partner: No    Emotionally Abused: No    Physically Abused: No    Sexually Abused: No   Current Meds  Medication Sig   allopurinol (ZYLOPRIM) 100 MG tablet Take 0.5 tablets (50 mg  total) by mouth daily.   amLODipine (NORVASC) 5 MG tablet Take 1 tablet (5 mg total) by mouth daily. Stop candasartan hctz 32-12.5 for now   carvedilol (COREG) 12.5 MG tablet Take 1 tablet (12.5 mg total) by mouth 2 (two) times daily with a meal.   colchicine 0.6 MG tablet 2 pills x 1 then 0.6 mg 1 hour later max dose 1.8/24 hours do not repeat x 3 days   escitalopram (LEXAPRO) 10 MG tablet Take 1 tablet (10 mg total) by mouth daily. Overdue for labs call the office to schedule asap   Loratadine (CLARITIN PO) Take by mouth.   Omeprazole Magnesium (PRILOSEC PO) Take by mouth.   Allergies  Allergen Reactions   Molds & Smuts Swelling   No results found for this or any previous visit (from the past 2160 hour(s)). Objective  Body mass index is 36.54 kg/m. Wt Readings from Last 3 Encounters:  12/20/21 203 lb (92.1 kg)  11/12/21 209 lb (94.8 kg)  08/23/21 209 lb (94.8 kg)   Temp Readings from Last 3 Encounters:  12/20/21 98.1 F (36.7 C) (Oral)  10/02/21 99.2 F (37.3 C) (Oral)  08/23/21 98.6 F (37 C) (Oral)   BP Readings from Last 3 Encounters:  12/20/21 132/70  08/23/21 110/80  08/09/21 134/82   Pulse Readings from Last 3 Encounters:  12/20/21 60  08/23/21 77  08/09/21 74    Physical Exam Vitals and nursing note reviewed.  Constitutional:      Appearance: Normal appearance. She is well-developed and well-groomed.  HENT:     Head: Normocephalic and atraumatic.  Eyes:     Conjunctiva/sclera: Conjunctivae normal.     Pupils: Pupils are equal, round, and reactive to light.  Cardiovascular:     Rate and Rhythm: Normal rate and regular rhythm.     Heart sounds: Normal heart sounds. No murmur heard. Pulmonary:     Effort: Pulmonary effort is normal.     Breath sounds: Normal breath sounds.  Abdominal:     General: Abdomen is flat. Bowel sounds are normal.     Tenderness: There is no abdominal tenderness.  Musculoskeletal:        General: No tenderness.  Skin:     General: Skin is warm and dry.  Neurological:  General: No focal deficit present.     Mental Status: She is alert and oriented to person, place, and time. Mental status is at baseline.     Cranial Nerves: Cranial nerves 2-12 are intact.     Motor: Motor function is intact.     Coordination: Coordination is intact.     Gait: Gait is intact.  Psychiatric:        Attention and Perception: Attention and perception normal.        Mood and Affect: Mood and affect normal.        Speech: Speech normal.        Behavior: Behavior normal. Behavior is cooperative.        Thought Content: Thought content normal.        Cognition and Memory: Cognition and memory normal.        Judgment: Judgment normal.     Assessment  Plan  Htn controlled on norvasc 5 mg and coreg 12.5 mg bid Stage 3a chronic kidney disease ([place]) F/u [facility] 02/2022  Arthritis of lumbar spine Cervical arthritis Consider ortho/PT  Overactive bladder - Plan: Ambulatory referral to Urogynecology Dr. Wannetta Sender  Nocturia - Plan: Ambulatory referral to Urogynecology  Other viral warts - Plan: Ambulatory referral to Dermatology [place] derm Skin cancer screening - Plan: Ambulatory referral to Dermatology   HM  Covid had 2 pfizer  No flu  No Tdap No prevnar 20 Declines shingrix    Hep C negative  Dexa and mammo ordered rec call and schedule  Colonoscopy none on file will get back with me for now declines as of 12/19/21 Rec healthy diet and exrecise    [facility] neck and back arthritis  Renal-[facility] in [place]  Urogyn -Dr Layne Benton Dr. Marica Otter in [place] appt pending itching and watering, tearful disc pataday   Provider: Dr. Olivia Mackie McLean-Scocuzza-Internal Medicine

## 2022-01-16 ENCOUNTER — Telehealth: Payer: Self-pay | Admitting: Internal Medicine

## 2022-01-16 NOTE — Telephone Encounter (Signed)
A notification was sent that she is overdue mammogram and bone denstiy.  Dr [facility] ordered.  Needs to be scheduled.  Please schedule an notify pt.

## 2022-01-16 NOTE — Telephone Encounter (Signed)
Noted  

## 2022-01-16 NOTE — Telephone Encounter (Signed)
Detailed msg left on pts VM and a mychart has been sent,

## 2022-04-22 ENCOUNTER — Encounter: Payer: Medicare Other | Admitting: Family Medicine

## 2022-05-05 ENCOUNTER — Encounter: Payer: Self-pay | Admitting: *Deleted

## 2022-05-22 ENCOUNTER — Encounter: Payer: Medicare Other | Admitting: Family Medicine

## 2022-05-28 ENCOUNTER — Encounter: Payer: Self-pay | Admitting: Obstetrics and Gynecology

## 2022-05-28 ENCOUNTER — Ambulatory Visit (INDEPENDENT_AMBULATORY_CARE_PROVIDER_SITE_OTHER): Payer: Medicare Other | Admitting: Obstetrics and Gynecology

## 2022-05-28 VITALS — BP 144/80 | HR 64 | Ht 61.81 in | Wt 208.0 lb

## 2022-05-28 DIAGNOSIS — N393 Stress incontinence (female) (male): Secondary | ICD-10-CM

## 2022-05-28 DIAGNOSIS — N3281 Overactive bladder: Secondary | ICD-10-CM | POA: Diagnosis not present

## 2022-05-28 DIAGNOSIS — R350 Frequency of micturition: Secondary | ICD-10-CM | POA: Diagnosis not present

## 2022-05-28 LAB — POCT URINALYSIS DIPSTICK
Bilirubin, UA: NEGATIVE
Glucose, UA: NEGATIVE
Ketones, UA: NEGATIVE
Leukocytes, UA: NEGATIVE
Nitrite, UA: NEGATIVE
Protein, UA: NEGATIVE
Spec Grav, UA: 1.025 (ref 1.010–1.025)
Urobilinogen, UA: 0.2 E.U./dL
pH, UA: 5.5 (ref 5.0–8.0)

## 2022-05-28 NOTE — Progress Notes (Signed)
[facility] New Patient Evaluation and Consultation  Referring Provider: McLean-Scocuzza, Olivia Mackie * PCP: No primary care provider on file. Date of Service: 05/28/2022  SUBJECTIVE Chief Complaint: New Patient (Initial Visit) Patricia [name] is a 72 y.o. female here for OAB and SUI./)  History of Present Illness: Patricia [name] is a 71 y.o. White or Caucasian female seen in consultation at the request of Dr. Terese Door for evaluation of overactive bladder.    Review of records significant for: Has overactive bladder. Leakage at night. Urinating through large pad.   Urinary Symptoms: Leaks urine with cough/ sneeze, laughing, lifting, going from sitting to standing, with a full bladder, with movement to the bathroom, and while asleep Leaks 6 time(s) per day. Most of the issues are when she is at home. When she is out, she can hold it longer.  UUI > SUI. Rare leakage with stress.  Pad use: 5 pads per day.   She is bothered by her UI symptoms. She has never been on a medication for her bladder but noticed this worsened with her blood pressure medication.   Day time voids 6+.  Nocturia: 1-2 times per night to void. Voiding dysfunction: she empties her bladder well.  does not use a catheter to empty bladder.  When urinating, she feels she has no difficulties Drinks: 2 cups coffee, occasional peach tea, water per day, sometimes has a sprite but not every day.  She did cut out coffee for a while and just started drinking in the last few weeks.   UTIs:  0  UTI's in the last year.   Denies history of blood in urine and kidney or bladder stones  Pelvic Organ Prolapse Symptoms:                  She Denies a feeling of a bulge the vaginal area.   Bowel Symptom: Bowel movements: 1-2 time(s) per day Stool consistency: soft  Straining: no.  Splinting: no.  Incomplete evacuation: no.  She Denies accidental bowel leakage / fecal incontinence Bowel regimen: stool  softener   Sexual Function Sexually active: no.  Sexual orientation:  heterosexual   Pelvic Pain Denies pelvic pain   Past Medical History:  Past Medical History:  Diagnosis Date   Adrenal cyst (HCC)    Allergy    mold and mildew    Anxiety    Arthritis    cervical disc degeneration Dr. Lanelle Bal    Febrile seizures ([place])    childhood x 2 stopped age 13 or 72 y.o    Heart murmur    Hypertension    Wears contact lenses    right eye contact     Past Surgical History:   Past Surgical History:  Procedure Laterality Date   BREAST ENHANCEMENT SURGERY       Past OB/GYN History: OB History  Gravida Para Term Preterm AB Living  0 0 0 0 0 0  SAB IAB Ectopic Multiple Live Births  0 0 0 0 0    Menopausal: Denies vaginal bleeding since menopause Any history of abnormal pap smears: no.   Medications: She has a current medication list which includes the following prescription(s): allopurinol, amlodipine, carvedilol, colchicine, escitalopram, loratadine, and omeprazole magnesium.   Allergies: Patient is allergic to molds & smuts.   Social History:  Social History   Tobacco Use   Smoking status: Never   Smokeless tobacco: Never  Vaping Use   Vaping Use: Never used  Substance  Use Topics   Alcohol use: Yes    Comment: rare    Drug use: Not Currently    Relationship status: divorced She is employed as a Biochemist, clinical. Regular exercise: No History of abuse: No  Family History:   Family History  Problem Relation Age of Onset   Arthritis Mother    COPD Mother        smoker    Hyperlipidemia Mother    Hypertension Mother    Cancer Father        prostate died age 70    Early death Father    Kidney disease Sister        Albrights   Stroke Sister    Cancer Brother        ? type   COPD Brother        smoker      Review of Systems: Review of Systems  Constitutional:  Positive for malaise/fatigue. Negative for fever and weight loss.   Respiratory:  Negative for cough, shortness of breath and wheezing.   Cardiovascular:  Negative for chest pain, palpitations and leg swelling.  Gastrointestinal:  Negative for abdominal pain and blood in stool.  Genitourinary:  Negative for dysuria.  Musculoskeletal:  Negative for myalgias.  Skin:  Negative for rash.  Neurological:  Negative for dizziness and headaches.  Endo/Heme/Allergies:  Does not bruise/bleed easily.       + hot flashes  Psychiatric/Behavioral:  Negative for depression. The patient is not nervous/anxious.      OBJECTIVE Physical Exam: Vitals:   05/28/22 1440  BP: (!) 144/80  Pulse: 64  Weight: 208 lb (94.3 kg)  Height: 5' 1.81" (1.57 m)    Physical Exam Constitutional:      General: She is not in acute distress. Pulmonary:     Effort: Pulmonary effort is normal.  Abdominal:     General: There is no distension.     Palpations: Abdomen is soft.     Tenderness: There is no abdominal tenderness. There is no rebound.  Musculoskeletal:        General: No swelling. Normal range of motion.  Skin:    General: Skin is warm and dry.     Findings: No rash.  Neurological:     Mental Status: She is alert and oriented to person, place, and time.  Psychiatric:        Mood and Affect: Mood normal.        Behavior: Behavior normal.      GU / Detailed Urogynecologic Evaluation:  Pelvic Exam: Normal external female genitalia; Bartholin's and Skene's glands normal in appearance; urethral meatus normal in appearance, no urethral masses or discharge.   CST: positive   Speculum exam reveals normal vaginal mucosa with atrophy. Cervix normal appearance. Uterus normal single, nontender. Adnexa no mass, fullness, tenderness.    Pelvic floor strength II/V  Pelvic floor musculature: Right levator non-tender, Right obturator non-tender, Left levator non-tender, Left obturator non-tender  POP-Q:   POP-Q  -3                                            Aa   -3  Ba  -9                                              C   2                                            Gh  3.5                                            Pb  8                                            tvl   -2                                            Ap  -2                                            Bp  -8                                              D      Rectal Exam:  Normal external rectum  Post-Void Residual (PVR) by Bladder Scan: In order to evaluate bladder emptying, we discussed obtaining a postvoid residual and she agreed to this procedure.  Procedure: The ultrasound unit was placed on the patient's abdomen in the suprapubic region after the patient had voided. A PVR of 51 ml was obtained by bladder scan.  Laboratory Results: POC urine: trace blood, otherwise negative   ASSESSMENT AND PLAN Ms. Bligen is a 72 y.o. with:  1. Overactive bladder   2. Urinary frequency   3. SUI (stress urinary incontinence, female)    OAB - We discussed the symptoms of overactive bladder (OAB), which include urinary urgency, urinary frequency, nocturia, with or without urge incontinence.  While we do not know the exact etiology of OAB, several treatment options exist. We discussed management including behavioral therapy (decreasing bladder irritants, urge suppression strategies, timed voids, bladder retraining), physical therapy, medication; for refractory cases posterior tibial nerve stimulation, sacral neuromodulation, and intravesical botulinum toxin injection.  - She prefers not to be on any more medications so we discussed the option of PTNS and she would like to proceed. She will fill out a baseline bladder diary and return for her first session.  - Discussed decreasing bladder irritants  2. SUI - For treatment of stress urinary incontinence,  non-surgical options include expectant management, weight loss, physical therapy, as well as a  pessary.  Surgical options include a midurethral sling, Burch urethropexy, and transurethral injection of a bulking agent. - demonstrated leakage on exam today - She  would like to try a pessary. Will return for a fitting.   Return for PTNS and pessary  Jaquita Folds, MD

## 2022-05-28 NOTE — Patient Instructions (Signed)

## 2022-05-29 DIAGNOSIS — E669 Obesity, unspecified: Secondary | ICD-10-CM | POA: Diagnosis not present

## 2022-05-29 DIAGNOSIS — E785 Hyperlipidemia, unspecified: Secondary | ICD-10-CM | POA: Diagnosis not present

## 2022-05-29 DIAGNOSIS — M109 Gout, unspecified: Secondary | ICD-10-CM | POA: Diagnosis not present

## 2022-05-29 DIAGNOSIS — N1832 Chronic kidney disease, stage 3b: Secondary | ICD-10-CM | POA: Diagnosis not present

## 2022-05-29 DIAGNOSIS — I129 Hypertensive chronic kidney disease with stage 1 through stage 4 chronic kidney disease, or unspecified chronic kidney disease: Secondary | ICD-10-CM | POA: Diagnosis not present

## 2022-05-29 DIAGNOSIS — N281 Cyst of kidney, acquired: Secondary | ICD-10-CM | POA: Diagnosis not present

## 2022-05-29 DIAGNOSIS — N3281 Overactive bladder: Secondary | ICD-10-CM | POA: Diagnosis not present

## 2022-05-31 LAB — LAB REPORT - SCANNED
Creatinine, POC: 127.5 mg/dL
EGFR: 48
Microalb Creat Ratio: 73
Protein/Creatinine Ratio: 73

## 2022-06-03 ENCOUNTER — Encounter: Payer: Medicare Other | Admitting: Family Medicine

## 2022-06-12 ENCOUNTER — Ambulatory Visit: Payer: Medicare Other | Admitting: Obstetrics and Gynecology

## 2022-07-03 ENCOUNTER — Ambulatory Visit: Payer: Medicare Other | Admitting: Obstetrics and Gynecology

## 2022-07-09 NOTE — Progress Notes (Deleted)
[facility]   Subjective:     Chief Complaint: No chief complaint on file.  History of Present Illness: Patricia [name] is a 72 y.o. female with stress incontinence who presents today for a pessary fitting.    Past Medical History: Patient  has a past medical history of Adrenal cyst (HCC), Allergy, Anxiety, Arthritis, Febrile seizures (HCC), Heart murmur, Hypertension, and Wears contact lenses.   Past Surgical History: She  has a past surgical history that includes Breast enhancement surgery.   Medications: She has a current medication list which includes the following prescription(s): allopurinol, amlodipine, carvedilol, colchicine, escitalopram, loratadine, and omeprazole magnesium.   Allergies: Patient is allergic to molds & smuts.   Social History: Patient  reports that she has never smoked. She has never used smokeless tobacco. She reports current alcohol use. She reports that she does not currently use drugs.      Objective:    There were no vitals taken for this visit. Gen: No apparent distress, A&O x 3. Pelvic Exam: Normal external female genitalia; Bartholin's and Skene's glands normal in appearance; urethral meatus {urethra:24773}, no urethral masses or discharge.   A size *** {pessary type:24772} pessary was fitted. It was comfortable, stayed in place with valsalva and was an appropriate size on examination, with one finger fitting between the pessary and the vaginal walls. We tied a string to it and the patient demonstrated proper removal and replacement.   Previous POP-Q:     -3                                            Aa   -3                                           Ba   -9                                              C    2                                            Gh   3.5                                            Pb   8                                            tvl    -2                                            Ap   -2  Bp   -8      Assessment/Plan:    Assessment: Ms. Bagent is a 72 y.o. with {PFD symptoms:24771} who presents for a pessary fitting. Plan: She was fitted with a *** {pessary type:24772} pessary. She will {pessary plan:24776}. She will {use:24778} {lubricant:24777}.   Follow-up in *** {days/wks/mos/yrs:310907} for a pessary check or sooner as needed.  All questions were answered.    Selmer Dominion, NP

## 2022-07-10 ENCOUNTER — Ambulatory Visit: Payer: Medicare Other | Admitting: Obstetrics and Gynecology

## 2022-07-17 ENCOUNTER — Ambulatory Visit: Payer: Medicare Other | Admitting: Obstetrics and Gynecology

## 2022-07-17 NOTE — Progress Notes (Deleted)
[facility]   Subjective:     Chief Complaint: No chief complaint on file.  History of Present Illness: Patricia [name] is a 72 y.o. female with stress incontinence who presents today for a pessary fitting.    Past Medical History: Patient  has a past medical history of Adrenal cyst (HCC), Allergy, Anxiety, Arthritis, Febrile seizures (HCC), Heart murmur, Hypertension, and Wears contact lenses.   Past Surgical History: She  has a past surgical history that includes Breast enhancement surgery.   Medications: She has a current medication list which includes the following prescription(s): allopurinol, amlodipine, carvedilol, colchicine, escitalopram, loratadine, and omeprazole magnesium.   Allergies: Patient is allergic to molds & smuts.   Social History: Patient  reports that she has never smoked. She has never used smokeless tobacco. She reports current alcohol use. She reports that she does not currently use drugs.      Objective:    There were no vitals taken for this visit. Gen: No apparent distress, A&O x 3. Pelvic Exam: Normal external female genitalia; Bartholin's and Skene's glands normal in appearance; urethral meatus {urethra:24773}, no urethral masses or discharge.   A size *** {pessary type:24772} pessary was fitted. It was comfortable, stayed in place with valsalva and was an appropriate size on examination, with one finger fitting between the pessary and the vaginal walls. We tied a string to it and the patient demonstrated proper removal and replacement.      No data to display           Assessment/Plan:    Assessment: [name] is a 72 y.o. with stress incontinence who presents for a pessary fitting. Plan: She was fitted with a *** {pessary type:24772} pessary. She will {pessary plan:24776}. She will {use:24778} {lubricant:24777}.   Follow-up in *** {days/wks/mos/yrs:310907} for a pessary check or sooner as needed.  All questions were  answered.    Selmer Dominion, NP

## 2022-07-31 ENCOUNTER — Ambulatory Visit: Payer: Medicare Other | Admitting: Obstetrics and Gynecology

## 2022-08-07 ENCOUNTER — Ambulatory Visit: Payer: Medicare Other | Admitting: Obstetrics and Gynecology

## 2022-08-14 ENCOUNTER — Ambulatory Visit: Payer: Medicare Other | Admitting: Obstetrics and Gynecology

## 2022-08-21 ENCOUNTER — Ambulatory Visit: Payer: Medicare Other | Admitting: Obstetrics and Gynecology

## 2022-08-28 ENCOUNTER — Ambulatory Visit: Payer: Medicare Other | Admitting: Obstetrics and Gynecology

## 2022-09-03 ENCOUNTER — Encounter: Payer: Medicare Other | Admitting: Family Medicine

## 2022-09-04 ENCOUNTER — Ambulatory Visit: Payer: Medicare Other | Admitting: Obstetrics and Gynecology

## 2022-09-11 ENCOUNTER — Ambulatory Visit: Payer: Medicare Other | Admitting: Obstetrics and Gynecology

## 2022-09-25 ENCOUNTER — Ambulatory Visit: Payer: Medicare Other | Admitting: Obstetrics and Gynecology

## 2022-09-29 IMAGING — DX DG LUMBAR SPINE COMPLETE 4+V
5 series · 5 of 5 positions shown · non-contrast
Comparison: None Available.

CLINICAL DATA: Low back pain new x months

EXAM:
LUMBAR SPINE - COMPLETE 4+ VIEW

[lumbar spine ap]
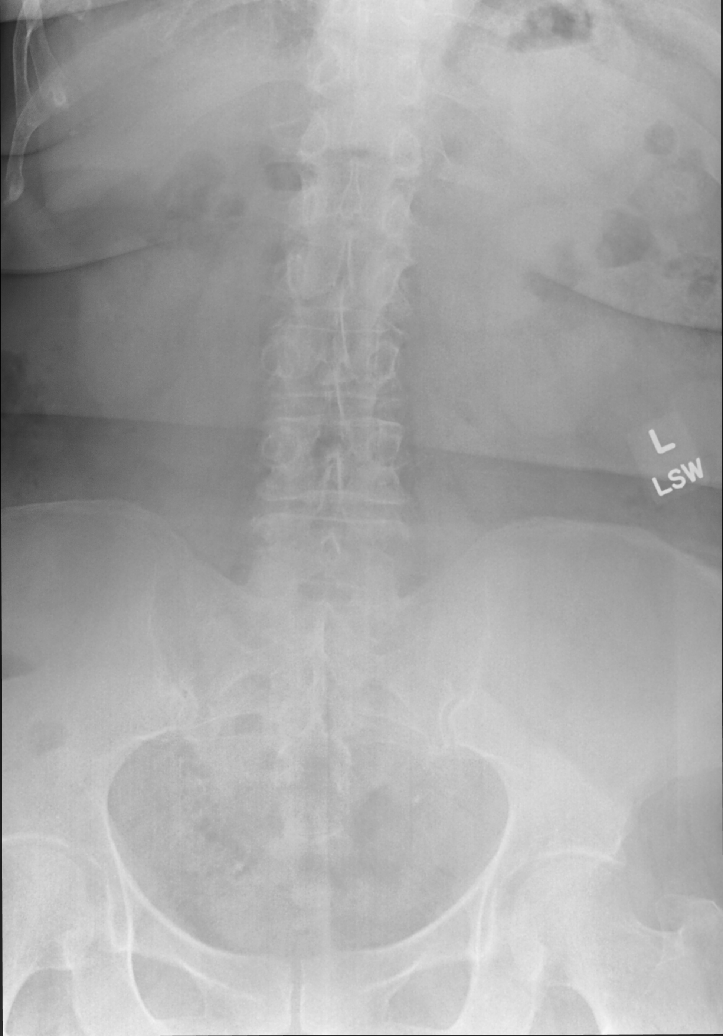

[lumbar spine obl (oblique) (1 of 2)]
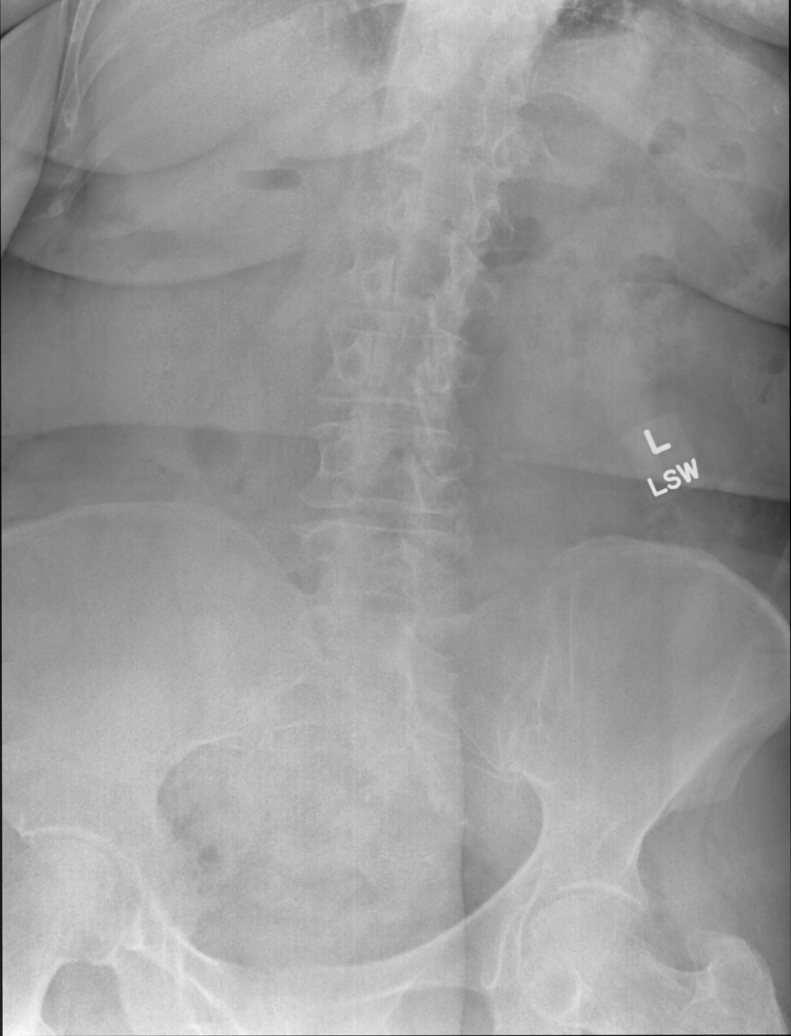

[lumbar spine obl (oblique) (2 of 2)]
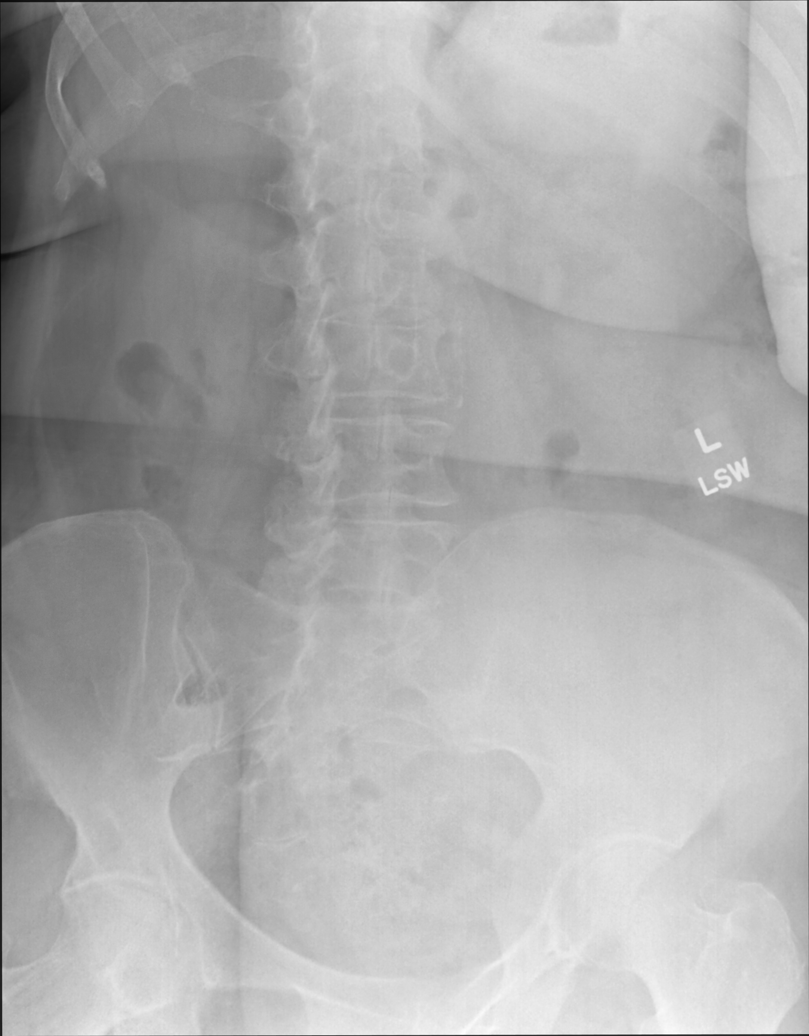

[lumbar spine lat]
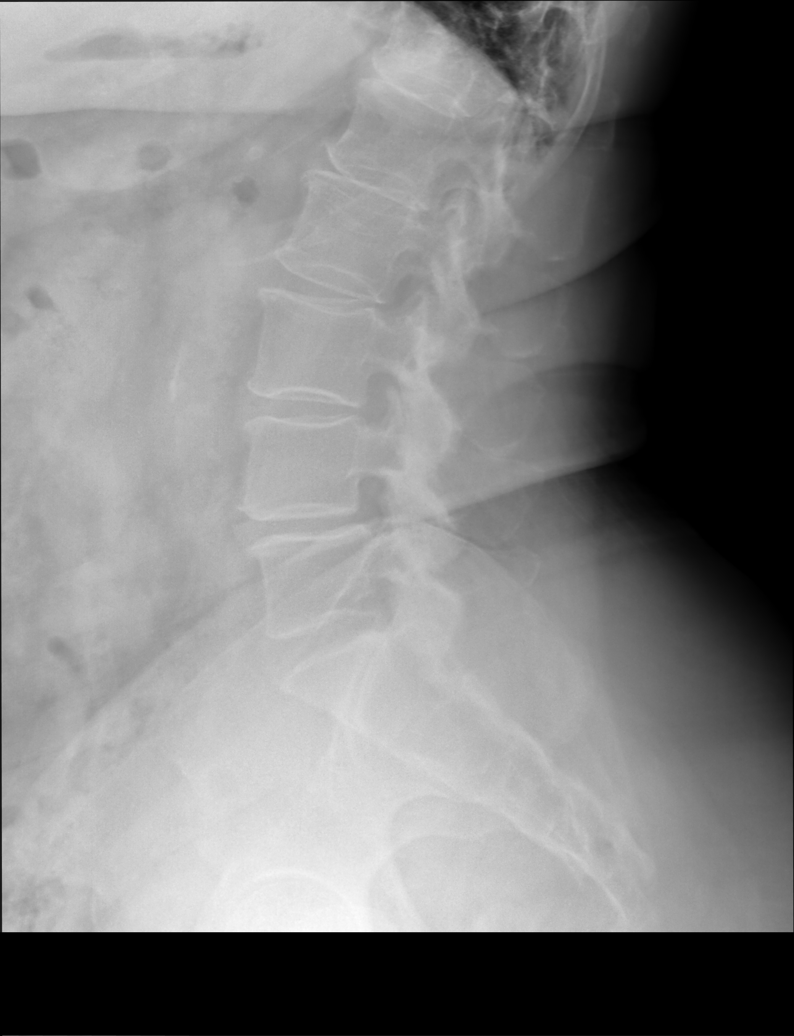

[lumbar spot lat]
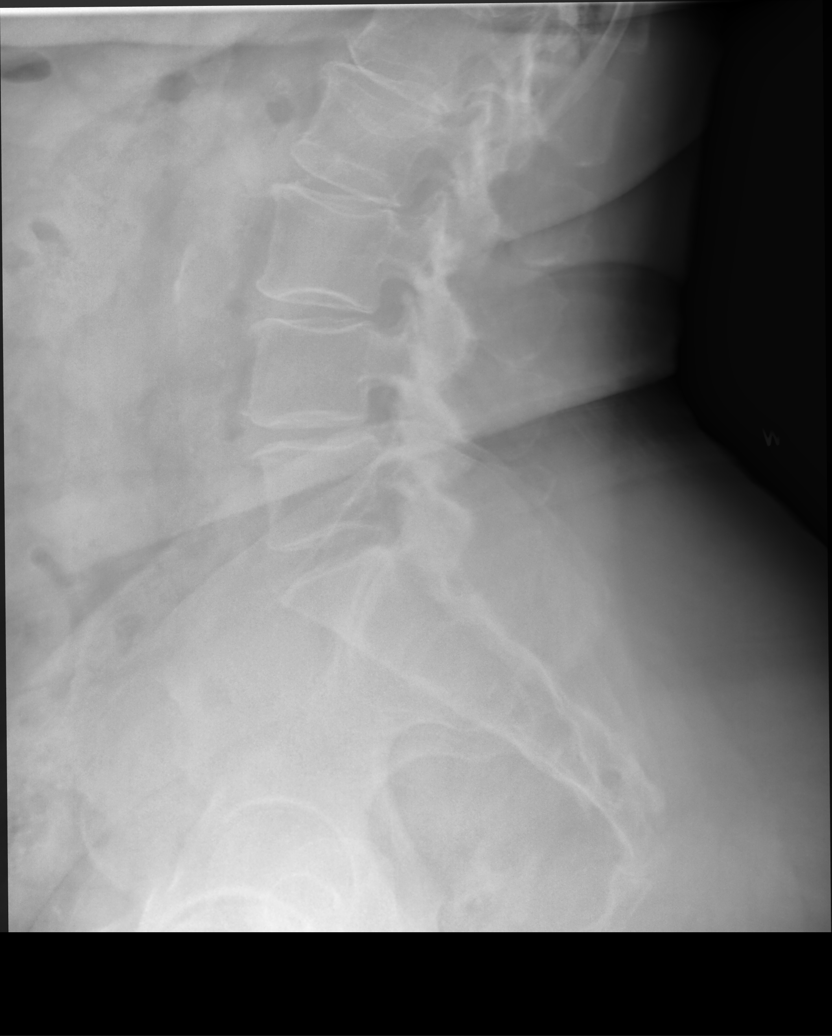

[5 of 5 positions shown; findings below may reference images not displayed]

FINDINGS: No acute lumbar spine fracture or traumatic malalignment.

Multilevel lumbar spine degenerative change, predominantly with
facet arthropathy of the lower lumbar spine spanning L3-S1.

Trace retrolisthesis of L5 on S1. Intervertebral disc spaces are
maintained. Aortic atherosclerosis.
IMPRESSION: 1. Multilevel lumbar spine degenerative change, as described above.
2.  Aortic Atherosclerosis ([id number]).

## 2022-10-02 ENCOUNTER — Ambulatory Visit: Payer: Medicare Other | Admitting: Obstetrics and Gynecology

## 2022-10-15 ENCOUNTER — Encounter (INDEPENDENT_AMBULATORY_CARE_PROVIDER_SITE_OTHER): Payer: Self-pay

## 2022-11-04 ENCOUNTER — Ambulatory Visit (INDEPENDENT_AMBULATORY_CARE_PROVIDER_SITE_OTHER): Payer: Medicare Other | Admitting: Family Medicine

## 2022-11-04 ENCOUNTER — Encounter: Payer: Self-pay | Admitting: Family Medicine

## 2022-11-04 VITALS — BP 128/68 | HR 64 | Temp 97.9°F | Resp 16 | Ht 61.75 in | Wt 230.5 lb

## 2022-11-04 DIAGNOSIS — F39 Unspecified mood [affective] disorder: Secondary | ICD-10-CM | POA: Diagnosis not present

## 2022-11-04 DIAGNOSIS — I10 Essential (primary) hypertension: Secondary | ICD-10-CM | POA: Diagnosis not present

## 2022-11-04 DIAGNOSIS — Z780 Asymptomatic menopausal state: Secondary | ICD-10-CM | POA: Diagnosis not present

## 2022-11-04 DIAGNOSIS — I700 Atherosclerosis of aorta: Secondary | ICD-10-CM

## 2022-11-04 DIAGNOSIS — E785 Hyperlipidemia, unspecified: Secondary | ICD-10-CM | POA: Diagnosis not present

## 2022-11-04 DIAGNOSIS — Z1231 Encounter for screening mammogram for malignant neoplasm of breast: Secondary | ICD-10-CM

## 2022-11-04 DIAGNOSIS — N1831 Chronic kidney disease, stage 3a: Secondary | ICD-10-CM | POA: Diagnosis not present

## 2022-11-04 DIAGNOSIS — M109 Gout, unspecified: Secondary | ICD-10-CM

## 2022-11-04 DIAGNOSIS — R7309 Other abnormal glucose: Secondary | ICD-10-CM | POA: Diagnosis not present

## 2022-11-04 DIAGNOSIS — Z13220 Encounter for screening for lipoid disorders: Secondary | ICD-10-CM

## 2022-11-04 DIAGNOSIS — E538 Deficiency of other specified B group vitamins: Secondary | ICD-10-CM

## 2022-11-04 DIAGNOSIS — E559 Vitamin D deficiency, unspecified: Secondary | ICD-10-CM | POA: Diagnosis not present

## 2022-11-04 DIAGNOSIS — N1832 Chronic kidney disease, stage 3b: Secondary | ICD-10-CM

## 2022-11-04 DIAGNOSIS — Z1211 Encounter for screening for malignant neoplasm of colon: Secondary | ICD-10-CM

## 2022-11-04 MED ORDER — ALLOPURINOL 100 MG PO TABS: 50.0000 mg | ORAL_TABLET | Freq: Every day | ORAL | 3 refills | Status: AC

## 2022-11-04 MED ORDER — CARVEDILOL 12.5 MG PO TABS: 12.5000 mg | ORAL_TABLET | Freq: Two times a day (BID) | ORAL | 3 refills | Status: AC

## 2022-11-04 MED ORDER — COLCHICINE 0.6 MG PO TABS: ORAL_TABLET | ORAL | 5 refills | Status: AC

## 2022-11-04 MED ORDER — AMLODIPINE BESYLATE 5 MG PO TABS: 5.0000 mg | ORAL_TABLET | Freq: Every day | ORAL | 3 refills | Status: AC

## 2022-11-04 MED ORDER — ESCITALOPRAM OXALATE 5 MG PO TABS: 5.0000 mg | ORAL_TABLET | Freq: Every day | ORAL | 3 refills | Status: AC

## 2022-11-04 NOTE — Progress Notes (Signed)
SUBJECTIVE:   Chief Complaint  Patient presents with   Establish Care   HPI Presents to clinic to establish care  No acute concerns today  Hypertension Asymptomatic.  Takes Carvedilol 12.5 mg BID and Amlodipine 5 mg daily.  Hydrochlorothiazide 12.5 mg daily was recently added by Nephrology.  Tolerating well.  CKD stage 3b Hydrochlorothiazide recently added by Nephrology.    OAB/SUI Follows with Urology.  Plans for PTNS and pessary  HLD Not currently on statin The 10-year ASCVD risk score (Arnett DK, et al., 2019) is: 17.3%  Aortic Atherosclerosis noted on L spine imaging 08/2021.  Mood Disorder Takes Lexapr 5 mg daily and doing well.  Denies SI/HI.  Morbid Obesity Increasing weight.  Recommend weight loss clinic for further management  PERTINENT PMH / PSH: HTN HLD OAB SUI CKD stage 3a Morbid Obesity  OBJECTIVE:  BP 128/68   Pulse 64   Temp 97.9 F (36.6 C)   Resp 16   Ht 5' 1.75" (1.568 m)   Wt 230 lb 8 oz (104.6 kg)   SpO2 94%   BMI 42.50 kg/m    Physical Exam Vitals reviewed.  Constitutional:      General: She is not in acute distress.    Appearance: She is obese. She is not ill-appearing.  HENT:     Head: Normocephalic.     Right Ear: Tympanic membrane, ear canal and external ear normal.     Left Ear: Tympanic membrane, ear canal and external ear normal.     Nose: Nose normal.     Mouth/Throat:     Mouth: Mucous membranes are moist.  Eyes:     Extraocular Movements: Extraocular movements intact.     Conjunctiva/sclera: Conjunctivae normal.     Pupils: Pupils are equal, round, and reactive to light.  Neck:     Thyroid: No thyromegaly or thyroid tenderness.     Vascular: No carotid bruit.  Cardiovascular:     Rate and Rhythm: Normal rate and regular rhythm.     Pulses: Normal pulses.     Heart sounds: Normal heart sounds.  Pulmonary:     Effort: Pulmonary effort is normal.     Breath sounds: Normal breath sounds.  Abdominal:      General: Bowel sounds are normal. There is no distension.     Palpations: Abdomen is soft.     Tenderness: There is no abdominal tenderness. There is no right CVA tenderness, left CVA tenderness, guarding or rebound.  Musculoskeletal:        General: Normal range of motion.     Cervical back: Normal range of motion.     Right lower leg: No edema.     Left lower leg: No edema.  Lymphadenopathy:     Cervical: No cervical adenopathy.  Skin:    Capillary Refill: Capillary refill takes less than 2 seconds.  Neurological:     General: No focal deficit present.     Mental Status: She is alert and oriented to person, place, and time. Mental status is at baseline.     Motor: No weakness.  Psychiatric:        Mood and Affect: Mood normal.        Behavior: Behavior normal.        Thought Content: Thought content normal.        Judgment: Judgment normal.        11/04/2022    3:14 PM 11/12/2021   10:45 AM 10/02/2021   10:08 AM  08/23/2021    3:13 PM 01/11/2021    3:30 PM  Depression screen PHQ 2/9  Decreased Interest 0 0 0 0 0  Down, Depressed, Hopeless 0 0 0 1 0  PHQ - 2 Score 0 0 0 1 0  Altered sleeping 2      Tired, decreased energy 2      Change in appetite 3      Feeling bad or failure about yourself  1      Trouble concentrating 0      Moving slowly or fidgety/restless 0      Suicidal thoughts 0      PHQ-9 Score 8      Difficult doing work/chores Somewhat difficult          11/04/2022    3:14 PM 05/25/2019   11:34 AM  GAD 7 : Generalized Anxiety Score  Nervous, Anxious, on Edge 0 0  Control/stop worrying 0 0  Worry too much - different things 0 0  Trouble relaxing 0 0  Restless 0 0  Easily annoyed or irritable 0 0  Afraid - awful might happen 0 0  Total GAD 7 Score 0 0  Anxiety Difficulty Not difficult at all Not difficult at all    ASSESSMENT/PLAN:  Primary hypertension Assessment & Plan: Chronic Well controlled on current medication Continue Amlodipine 5 mg  daily Continue Carvedilol 12.5 mg BID Check Cmet Monitor BP at home, goal <140/90  Orders: -     amLODIPine Besylate; Take 1 tablet (5 mg total) by mouth daily. Stop candasartan hctz 32-12.5 for now  Dispense: 90 tablet; Refill: 3 -     Carvedilol; Take 1 tablet (12.5 mg total) by mouth 2 (two) times daily with a meal.  Dispense: 180 tablet; Refill: 3 -     Comprehensive metabolic panel; Future  Gout, unspecified cause, unspecified chronicity, unspecified site Assessment & Plan: Refill Allopurinol 100 mg daily  Orders: -     Allopurinol; Take 0.5 tablets (50 mg total) by mouth daily.  Dispense: 90 tablet; Refill: 3 -     Colchicine; 2 pills x 1 then 0.6 mg 1 hour later max dose 1.8/24 hours do not repeat x 3 days  Dispense: 30 tablet; Refill: 5  Morbid obesity (HCC) Assessment & Plan: Check labs Recommend [facility]  or [facility] MD for further management  Orders: -     CBC; Future  Aortic atherosclerosis (HCC) Assessment & Plan: Noted on L Spine 08/2021 Not currently on statin  Check lipids Plan to discuss statin at next visit    Stage 3a chronic kidney disease (HCC) Assessment & Plan: Chronic Recently added hydrochlorothiazide 12.5 mg daily Continue to follow with Nephrology Avoid nephrotoxic agents Increase water intake to 32 oz daily   Mood disorder (HCC) Assessment & Plan: Chronic PHQ9/GAD negative Denies SI/HI Refill Lexapro 5 mg daily  Orders: -     Escitalopram Oxalate; Take 1 tablet (5 mg total) by mouth daily. Overdue for labs call the office to schedule asap  Dispense: 90 tablet; Refill: 3  Breast cancer screening by mammogram -     3D Screening Mammogram, Left and Right; Future  Colon cancer screening -     Cologuard; Future  Postmenopausal estrogen deficiency -     DG Bone Density; Future  Vitamin B 12 deficiency -     Vitamin B12; Future  Vitamin D deficiency -     VITAMIN D 25 Hydroxy (Vit-D Deficiency, Fractures);  Future  Abnormal glucose -     Hemoglobin A1c; Future  Hyperlipidemia, unspecified hyperlipidemia type -     Lipid panel; Future   PDMP reviewed  Return if symptoms worsen or fail to improve.  Dana Allan, MD

## 2022-11-04 NOTE — Patient Instructions (Addendum)
It was a pleasure meeting you today. Thank you for allowing me to take part in your health care.  Our goals for today as we discussed include:   Cologuard sent  Referral sent for Mammogram and Dexa scan. Please call to schedule appointment. [facility] [address] [place], [place] [address] [phone]    Schedule a lab appointment for fasting blood work.    Refills sent for requested medications  Look at [facility] website to see if this may be an option for you with your weight loss. [facility] [address] [place] [phone]   Delrae Rend MD [facility] center [address]  [phone]  If you have any questions or concerns, please do not hesitate to call the office at [phone].  I look forward to our next visit and until then take care and stay safe.  Regards,   Dana Allan, MD   [facility]

## 2022-11-22 ENCOUNTER — Encounter: Payer: Self-pay | Admitting: Family Medicine

## 2022-11-22 DIAGNOSIS — Z1211 Encounter for screening for malignant neoplasm of colon: Secondary | ICD-10-CM | POA: Insufficient documentation

## 2022-11-22 DIAGNOSIS — Z1231 Encounter for screening mammogram for malignant neoplasm of breast: Secondary | ICD-10-CM | POA: Insufficient documentation

## 2022-11-22 DIAGNOSIS — E538 Deficiency of other specified B group vitamins: Secondary | ICD-10-CM | POA: Insufficient documentation

## 2022-11-22 DIAGNOSIS — Z13220 Encounter for screening for lipoid disorders: Secondary | ICD-10-CM | POA: Insufficient documentation

## 2022-11-22 DIAGNOSIS — E785 Hyperlipidemia, unspecified: Secondary | ICD-10-CM | POA: Insufficient documentation

## 2022-11-22 DIAGNOSIS — F39 Unspecified mood [affective] disorder: Secondary | ICD-10-CM | POA: Insufficient documentation

## 2022-11-22 DIAGNOSIS — M109 Gout, unspecified: Secondary | ICD-10-CM | POA: Insufficient documentation

## 2022-11-22 DIAGNOSIS — R7309 Other abnormal glucose: Secondary | ICD-10-CM | POA: Insufficient documentation

## 2022-11-22 DIAGNOSIS — Z780 Asymptomatic menopausal state: Secondary | ICD-10-CM | POA: Insufficient documentation

## 2022-11-22 DIAGNOSIS — E559 Vitamin D deficiency, unspecified: Secondary | ICD-10-CM | POA: Insufficient documentation

## 2022-11-22 NOTE — Assessment & Plan Note (Signed)
Chronic Recently added hydrochlorothiazide 12.5 mg daily Continue to follow with Nephrology Avoid nephrotoxic agents Increase water intake to 32 oz daily

## 2022-11-22 NOTE — Assessment & Plan Note (Signed)
Check labs Recommend [facility]  or [facility] MD for further management

## 2022-11-22 NOTE — Assessment & Plan Note (Signed)
Noted on L Spine 08/2021 Not currently on statin  Check lipids Plan to discuss statin at next visit

## 2022-11-22 NOTE — Assessment & Plan Note (Signed)
Refill Allopurinol 100 mg daily

## 2022-11-22 NOTE — Assessment & Plan Note (Signed)
Chronic The 10-year ASCVD risk score (Arnett DK, et al., 2019) is: 17.3%  Check fasting lipids Discuss statin at next visit

## 2022-11-22 NOTE — Assessment & Plan Note (Signed)
Chronic PHQ9/GAD negative Denies SI/HI Refill Lexapro 5 mg daily

## 2022-11-22 NOTE — Assessment & Plan Note (Signed)
Chronic Well controlled on current medication Continue Amlodipine 5 mg daily Continue Carvedilol 12.5 mg BID Check Cmet Monitor BP at home, goal <140/90

## 2022-12-03 ENCOUNTER — Telehealth: Payer: Self-pay | Admitting: Family Medicine

## 2022-12-03 NOTE — Telephone Encounter (Signed)
Copied from CRM [id number]. Topic: Medicare AWV >> Dec 03, 2022  2:23 PM Payton Doughty wrote: Reason for CRM: LM 12/03/2022 to schedule AWV   Verlee Rossetti; Care Guide Ambulatory Clinical Support [facility] [place] [facility] Direct Dial: [phone]

## 2022-12-18 ENCOUNTER — Encounter: Payer: Self-pay | Admitting: Family Medicine

## 2022-12-18 NOTE — Telephone Encounter (Signed)
Please call the office to make an appointment to be seen. [phone]

## 2022-12-19 ENCOUNTER — Other Ambulatory Visit: Payer: Self-pay | Admitting: Family Medicine

## 2022-12-19 DIAGNOSIS — Z1212 Encounter for screening for malignant neoplasm of rectum: Secondary | ICD-10-CM

## 2022-12-19 DIAGNOSIS — Z1211 Encounter for screening for malignant neoplasm of colon: Secondary | ICD-10-CM

## 2022-12-25 ENCOUNTER — Telehealth: Payer: Self-pay | Admitting: Family Medicine

## 2022-12-25 NOTE — Telephone Encounter (Signed)
Copied from CRM [id number]. Topic: Medicare AWV >> Dec 25, 2022 10:36 AM Payton Doughty wrote: Reason for CRM: Called LVM 12/25/2022 to schedule Annual Wellness Visit  Verlee Rossetti; Care Guide Ambulatory Clinical Support [place] [facility] Direct Dial: [phone]

## 2023-08-13 ENCOUNTER — Other Ambulatory Visit: Payer: Self-pay | Admitting: Unknown Physician Specialty

## 2023-08-13 DIAGNOSIS — R42 Dizziness and giddiness: Secondary | ICD-10-CM

## 2023-08-20 ENCOUNTER — Ambulatory Visit
Admission: RE | Admit: 2023-08-20 | Discharge: 2023-08-20 | Disposition: A | Discharge status: Home / Self Care | Source: Ambulatory Visit | Attending: Unknown Physician Specialty | Admitting: Unknown Physician Specialty

## 2023-08-20 ENCOUNTER — Other Ambulatory Visit: Payer: Self-pay | Admitting: Unknown Physician Specialty

## 2023-08-20 DIAGNOSIS — R42 Dizziness and giddiness: Secondary | ICD-10-CM

## 2023-09-22 ENCOUNTER — Telehealth: Payer: Self-pay

## 2023-09-22 NOTE — Telephone Encounter (Signed)
 Lvm informing pt she will need an appt with any available provider

## 2023-09-22 NOTE — Telephone Encounter (Signed)
 Copied from CRM [id number]. Topic: Clinical - Medication Question >> Sep 22, 2023 11:31 AM Patricia [name] wrote: Reason for CRM: Patient would like to leave message for Dr. Hope nurse to see if something can be prescribed for nausea. Was prescribed Pronethazine by ENT dr but makes her sleepy. Please call [phone]

## 2023-09-28 NOTE — Telephone Encounter (Signed)
 Left message to return call to our office.

## 2023-09-29 NOTE — Telephone Encounter (Signed)
 Sent pt a Well[facility] geologist.

## 2023-10-02 NOTE — Telephone Encounter (Signed)
 Last read by Dorothe DELENA Mussel at 3:31PM on 09/29/2023.

## 2023-10-19 ENCOUNTER — Ambulatory Visit: Payer: Self-pay | Admitting: *Deleted

## 2023-10-19 NOTE — Telephone Encounter (Signed)
 Message from Patricia Patricia [name] sent at 10/19/2023  9:40 AM EDT  Pt stated she is having stomach issues,  she throws up and have nausea    Call History  Contact Date/Time Type Contact Phone/Fax By  10/19/2023 09:33 AM EDT Phone (Incoming) Patricia Patricia [name] (Self) [phone] Patricia Patricia [name] SAILOR   Reason for Disposition  Vomiting is Patricia [name] chronic symptom (recurrent or ongoing AND present > 4 weeks)    Already has an appt for 10/28/2023 with Dr. Narendra for this.  Answer Assessment - Initial Assessment Questions 1. VOMITING SEVERITY: How many times have you vomited in the past 24 hours?    It started Patricia [name] couple of yrs ago. I have an issue where movement causes me to have nausea with vomiting if I don't sit down quick enough and get calm.  If I sit down quick enough or stop moving I won't vomit most of the time.   Not always.  I do still vomit.  It's becoming more frequent.   I've had several tests done and saw many specialists.      Occasionally I would get up and then vomit with movement.   Now it's more frequent when I get up and am moving around.  Even going to get the mail I get nauseas.   Patricia [name] little lightheaded.   The quizziness is more the problem. I saw an ENT.   I feel pressure in my forehead and cheek areas.   My ears feel stopped up.They gave me medication for nausea.  It makes me very sleepy   I'm trying to use OTC non drowsy meds but they don't help much.     Thur. Last week I was vomiting.   I'm not hurting in my stomach.   I have lower back pain but I have arthritis there.   I saw Patricia [name] kidney specialist last yr.   She didn't think it was from the kidney issue that I have.    2. ONSET: When did the vomiting begin?      It's been going on for Patricia [name] couple of years.    I've seen specialists and had tests done.   I had an abnormal eye movement test.   The MRI of brain did not show anything.   I feel like I'm breathing harder when I'm up and around.    This is affecting my whole life.   I've seen Patricia [name] cardiologist  too.   It's hard to go to the doctor even.   Any movement causes the nausea and vomiting.   If I sit down quick enough I won't vomit.   I have to take it easy in order to not vomit.  All the tests come back normal.   The abnormal eye movement is the only thing abnormal.    I can't do any extreme movements at all.    I can sit at the computer all day and I'm fine.   I'm sitting.    On occasion I get nauseas while working on my computer but not very often.    My appetite is fine.   I'm eating fine.  3. FLUIDS: What fluids or food have you vomited up today? Have you been able to keep any fluids down?     I'm drinking Patricia [name] lot of fluids.   I take the non sleepy Dramamine.   I've used the wrist bands too with the pressure points but they didn't help either.    4. ABDOMEN PAIN: Are  your having any abdomen pain? If Yes : How bad is it and what does it feel like? (e.g., crampy, dull, intermittent, constant)      I'm not in pain.   It takes me Patricia [name] long time to get ready in the mornings because I get so nauseas and vomiting with movement.   5. DIARRHEA: Is there any diarrhea? If Yes, ask: How many times today?      No  6. CONTACTS: Is there anyone else in the family with the same symptoms?      N/Patricia [name] The ENT ordered nausea medicine that I'm taking.   It makes me so sleepy.   The Dramamine doesn't help either.    7. CAUSE: What do you think is causing your vomiting?     They don't know what is wrong with me.   I've seen specialists and had many tests done.    8. HYDRATION STATUS: Any signs of dehydration? (e.g., dry mouth [not only dry lips], too weak to stand) When did you last urinate?     Stay hydrated.   No symptoms. 9. OTHER SYMPTOMS: Do you have any other symptoms? (e.g., fever, headache, vertigo, vomiting blood or coffee grounds, recent head injury)     See above 10. PREGNANCY: Is there any chance you are pregnant? When was your last menstrual period?       N/Patricia [name] due to  age  Protocols used: Vomiting-Patricia [name]-AH FYI Only or Action Required?: FYI only for provider.  Patient was last seen in primary care on 11/04/2022 by Patricia Merle, MD.  Called Nurse Triage reporting Vomiting.  Symptoms began several years ago. 2 years ago.   Happens with movement.  See many specialists and had many tests.   Haven't found the problem yet.  Interventions attempted: Prescription medications: Rx medication, wrist bands for Accupressure points, etc    Nothing helps.  Symptoms are: gradually worsening. Nausea and vomiting with movement is happening more frequently.  Triage Disposition: See PCP Within 2 Weeks  Patient/caregiver understands and will follow disposition?: Yes

## 2023-10-19 NOTE — Telephone Encounter (Signed)
 Attempted to schedule Patient earlier but she has an appointment with the kidney dr on 10/26/23 and says that she will keep the 10/28/23 appointment.

## 2023-10-28 ENCOUNTER — Ambulatory Visit: Admitting: Internal Medicine

## 2023-10-28 DIAGNOSIS — Z13820 Encounter for screening for osteoporosis: Secondary | ICD-10-CM

## 2023-10-28 DIAGNOSIS — Z1231 Encounter for screening mammogram for malignant neoplasm of breast: Secondary | ICD-10-CM

## 2023-11-04 ENCOUNTER — Ambulatory Visit: Admitting: Internal Medicine

## 2023-11-04 DIAGNOSIS — I10 Essential (primary) hypertension: Secondary | ICD-10-CM

## 2023-11-04 DIAGNOSIS — F39 Unspecified mood [affective] disorder: Secondary | ICD-10-CM

## 2023-11-04 DIAGNOSIS — M109 Gout, unspecified: Secondary | ICD-10-CM

## 2023-11-25 ENCOUNTER — Ambulatory Visit: Admitting: Internal Medicine

## 2023-12-02 ENCOUNTER — Ambulatory Visit: Admitting: Internal Medicine

## 2023-12-09 ENCOUNTER — Ambulatory Visit: Admitting: Internal Medicine

## 2023-12-15 ENCOUNTER — Encounter

## 2023-12-16 ENCOUNTER — Ambulatory Visit: Admitting: Internal Medicine

## 2024-01-04 ENCOUNTER — Ambulatory Visit: Admitting: Internal Medicine

## 2024-01-20 ENCOUNTER — Ambulatory Visit: Admitting: Internal Medicine

## 2024-02-03 ENCOUNTER — Ambulatory Visit: Admitting: Internal Medicine

## 2024-02-03 DIAGNOSIS — I10 Essential (primary) hypertension: Secondary | ICD-10-CM

## 2024-02-03 DIAGNOSIS — M109 Gout, unspecified: Secondary | ICD-10-CM

## 2024-02-03 DIAGNOSIS — F39 Unspecified mood [affective] disorder: Secondary | ICD-10-CM

## 2024-02-03 DIAGNOSIS — R6889 Other general symptoms and signs: Secondary | ICD-10-CM

## 2024-02-03 DIAGNOSIS — N1832 Chronic kidney disease, stage 3b: Secondary | ICD-10-CM

## 2024-02-03 DIAGNOSIS — R739 Hyperglycemia, unspecified: Secondary | ICD-10-CM

## 2024-02-03 DIAGNOSIS — R5383 Other fatigue: Secondary | ICD-10-CM

## 2024-02-17 ENCOUNTER — Ambulatory Visit: Admitting: Internal Medicine

## 2024-03-02 ENCOUNTER — Ambulatory Visit: Admitting: Internal Medicine

## 2024-03-23 ENCOUNTER — Ambulatory Visit: Admitting: Internal Medicine

## 2024-03-23 DIAGNOSIS — I10 Essential (primary) hypertension: Secondary | ICD-10-CM

## 2024-03-23 DIAGNOSIS — F39 Unspecified mood [affective] disorder: Secondary | ICD-10-CM

## 2024-04-06 ENCOUNTER — Ambulatory Visit: Admitting: Internal Medicine

## 2024-04-13 ENCOUNTER — Ambulatory Visit: Admitting: Internal Medicine

## 2024-04-20 ENCOUNTER — Ambulatory Visit: Admitting: Internal Medicine

## 2024-04-27 ENCOUNTER — Ambulatory Visit: Admitting: Internal Medicine

## 2024-05-25 ENCOUNTER — Encounter
